# Patient Record
Sex: Female | Born: 1975 | Hispanic: Yes | Marital: Married | Smoking: Never smoker
Health system: Southern US, Community
[De-identification: ages and names within clinical notes are randomized; demographics above are authoritative.]

## PROBLEM LIST (undated history)

## (undated) DIAGNOSIS — A749 Chlamydial infection, unspecified: Secondary | ICD-10-CM

## (undated) DIAGNOSIS — O24419 Gestational diabetes mellitus in pregnancy, unspecified control: Secondary | ICD-10-CM

## (undated) DIAGNOSIS — I10 Essential (primary) hypertension: Secondary | ICD-10-CM

## (undated) DIAGNOSIS — N390 Urinary tract infection, site not specified: Secondary | ICD-10-CM

## (undated) HISTORY — PX: NO PAST SURGERIES: SHX2092

---

## 1998-02-10 ENCOUNTER — Ambulatory Visit (HOSPITAL_COMMUNITY): Admission: RE | Admit: 1998-02-10 | Discharge: 1998-02-10 | Payer: Self-pay | Admitting: Obstetrics

## 1998-04-12 ENCOUNTER — Other Ambulatory Visit: Admission: RE | Admit: 1998-04-12 | Discharge: 1998-04-12 | Payer: Self-pay | Admitting: *Deleted

## 1998-07-10 ENCOUNTER — Inpatient Hospital Stay (HOSPITAL_COMMUNITY): Admission: AD | Admit: 1998-07-10 | Discharge: 1998-07-10 | Payer: Self-pay | Admitting: *Deleted

## 1998-07-12 ENCOUNTER — Inpatient Hospital Stay (HOSPITAL_COMMUNITY): Admission: AD | Admit: 1998-07-12 | Discharge: 1998-07-14 | Payer: Self-pay | Admitting: Obstetrics

## 1998-07-12 ENCOUNTER — Encounter (HOSPITAL_COMMUNITY): Admission: RE | Admit: 1998-07-12 | Discharge: 1998-07-12 | Payer: Self-pay | Admitting: Obstetrics

## 2000-06-05 ENCOUNTER — Encounter: Admission: RE | Admit: 2000-06-05 | Discharge: 2000-06-05 | Payer: Self-pay | Admitting: Internal Medicine

## 2000-10-30 ENCOUNTER — Encounter: Admission: RE | Admit: 2000-10-30 | Discharge: 2000-10-30 | Payer: Self-pay | Admitting: Hematology and Oncology

## 2000-10-30 ENCOUNTER — Encounter: Payer: Self-pay | Admitting: *Deleted

## 2000-10-30 ENCOUNTER — Ambulatory Visit (HOSPITAL_COMMUNITY): Admission: RE | Admit: 2000-10-30 | Discharge: 2000-10-30 | Payer: Self-pay | Admitting: *Deleted

## 2001-01-22 ENCOUNTER — Ambulatory Visit (HOSPITAL_COMMUNITY): Admission: RE | Admit: 2001-01-22 | Discharge: 2001-01-22 | Payer: Self-pay | Admitting: *Deleted

## 2001-04-12 ENCOUNTER — Encounter (HOSPITAL_COMMUNITY): Admission: RE | Admit: 2001-04-12 | Discharge: 2001-04-30 | Payer: Self-pay | Admitting: *Deleted

## 2001-04-29 ENCOUNTER — Inpatient Hospital Stay (HOSPITAL_COMMUNITY): Admission: AD | Admit: 2001-04-29 | Discharge: 2001-05-01 | Payer: Self-pay | Admitting: *Deleted

## 2001-08-27 ENCOUNTER — Encounter: Admission: RE | Admit: 2001-08-27 | Discharge: 2001-08-27 | Payer: Self-pay | Admitting: Internal Medicine

## 2001-11-12 ENCOUNTER — Emergency Department (HOSPITAL_COMMUNITY): Admission: EM | Admit: 2001-11-12 | Discharge: 2001-11-12 | Payer: Self-pay | Admitting: Emergency Medicine

## 2002-04-13 ENCOUNTER — Emergency Department (HOSPITAL_COMMUNITY): Admission: EM | Admit: 2002-04-13 | Discharge: 2002-04-14 | Payer: Self-pay | Admitting: *Deleted

## 2002-08-28 ENCOUNTER — Encounter: Admission: RE | Admit: 2002-08-28 | Discharge: 2002-08-28 | Payer: Self-pay | Admitting: Obstetrics and Gynecology

## 2002-08-29 ENCOUNTER — Encounter: Admission: RE | Admit: 2002-08-29 | Discharge: 2002-08-29 | Payer: Self-pay | Admitting: Internal Medicine

## 2002-11-17 ENCOUNTER — Encounter: Admission: RE | Admit: 2002-11-17 | Discharge: 2002-11-17 | Payer: Self-pay | Admitting: Internal Medicine

## 2003-01-29 ENCOUNTER — Encounter: Admission: RE | Admit: 2003-01-29 | Discharge: 2003-01-29 | Payer: Self-pay | Admitting: Obstetrics and Gynecology

## 2003-05-28 ENCOUNTER — Inpatient Hospital Stay (HOSPITAL_COMMUNITY): Admission: AD | Admit: 2003-05-28 | Discharge: 2003-05-28 | Payer: Self-pay | Admitting: Obstetrics and Gynecology

## 2003-05-29 ENCOUNTER — Inpatient Hospital Stay (HOSPITAL_COMMUNITY): Admission: AD | Admit: 2003-05-29 | Discharge: 2003-05-29 | Payer: Self-pay | Admitting: Family Medicine

## 2003-06-25 ENCOUNTER — Encounter: Admission: RE | Admit: 2003-06-25 | Discharge: 2003-06-25 | Payer: Self-pay | Admitting: Obstetrics and Gynecology

## 2004-08-16 ENCOUNTER — Ambulatory Visit (HOSPITAL_COMMUNITY): Admission: RE | Admit: 2004-08-16 | Discharge: 2004-08-16 | Payer: Self-pay | Admitting: *Deleted

## 2004-09-02 ENCOUNTER — Inpatient Hospital Stay (HOSPITAL_COMMUNITY): Admission: AD | Admit: 2004-09-02 | Discharge: 2004-09-02 | Payer: Self-pay | Admitting: *Deleted

## 2004-09-02 ENCOUNTER — Ambulatory Visit: Payer: Self-pay | Admitting: Family Medicine

## 2004-10-28 ENCOUNTER — Ambulatory Visit (HOSPITAL_COMMUNITY): Admission: RE | Admit: 2004-10-28 | Discharge: 2004-10-28 | Payer: Self-pay | Admitting: Family Medicine

## 2004-11-01 ENCOUNTER — Encounter: Admission: RE | Admit: 2004-11-01 | Discharge: 2004-11-01 | Payer: Self-pay | Admitting: *Deleted

## 2005-01-20 ENCOUNTER — Ambulatory Visit (HOSPITAL_COMMUNITY): Admission: RE | Admit: 2005-01-20 | Discharge: 2005-01-20 | Payer: Self-pay | Admitting: *Deleted

## 2005-01-22 ENCOUNTER — Ambulatory Visit: Payer: Self-pay | Admitting: Obstetrics and Gynecology

## 2005-01-22 ENCOUNTER — Inpatient Hospital Stay (HOSPITAL_COMMUNITY): Admission: AD | Admit: 2005-01-22 | Discharge: 2005-01-24 | Payer: Self-pay | Admitting: Obstetrics & Gynecology

## 2009-05-03 ENCOUNTER — Encounter: Admission: RE | Admit: 2009-05-03 | Discharge: 2009-05-03 | Payer: Self-pay | Admitting: Family Medicine

## 2009-05-28 ENCOUNTER — Emergency Department (HOSPITAL_COMMUNITY): Admission: EM | Admit: 2009-05-28 | Discharge: 2009-05-29 | Payer: Self-pay | Admitting: Emergency Medicine

## 2010-12-18 NOTE — L&D Delivery Note (Signed)
Delivery Note At 9:00 AM a viable female was delivered via Vaginal, Spontaneous Delivery (Presentation: Left Occiput Anterior).  APGAR: 9, 9; weight .   Placenta status: Intact, Spontaneous.  Cord: 3 vessels with the following complications: None.   Anesthesia: Epidural  Episiotomy: None Lacerations: None Est. Blood Loss (mL):  Mom to postpartum.  Baby to nursery-stable.  BOOTH, ERIN 08/06/2011, 9:13 AM

## 2011-01-18 ENCOUNTER — Inpatient Hospital Stay (HOSPITAL_COMMUNITY)
Admission: AD | Admit: 2011-01-18 | Discharge: 2011-01-18 | Disposition: A | Discharge status: Home / Self Care | Payer: Self-pay | Source: Ambulatory Visit | Attending: Obstetrics & Gynecology | Admitting: Obstetrics & Gynecology

## 2011-01-18 DIAGNOSIS — O209 Hemorrhage in early pregnancy, unspecified: Secondary | ICD-10-CM | POA: Insufficient documentation

## 2011-01-18 DIAGNOSIS — B373 Candidiasis of vulva and vagina: Secondary | ICD-10-CM

## 2011-01-18 DIAGNOSIS — O2390 Unspecified genitourinary tract infection in pregnancy, unspecified trimester: Secondary | ICD-10-CM

## 2011-01-18 DIAGNOSIS — B3731 Acute candidiasis of vulva and vagina: Secondary | ICD-10-CM | POA: Insufficient documentation

## 2011-01-18 LAB — URINALYSIS, ROUTINE W REFLEX MICROSCOPIC
Ketones, ur: NEGATIVE mg/dL
Nitrite: NEGATIVE
Protein, ur: NEGATIVE mg/dL
Urine Glucose, Fasting: 250 mg/dL — AB
Urobilinogen, UA: 0.2 mg/dL (ref 0.0–1.0)

## 2011-01-18 LAB — WET PREP, GENITAL
Trich, Wet Prep: NONE SEEN
Yeast Wet Prep HPF POC: NONE SEEN

## 2011-01-18 LAB — URINE MICROSCOPIC-ADD ON

## 2011-02-20 ENCOUNTER — Other Ambulatory Visit: Payer: Self-pay | Admitting: Family Medicine

## 2011-02-20 ENCOUNTER — Ambulatory Visit (HOSPITAL_COMMUNITY)
Admission: RE | Admit: 2011-02-20 | Discharge: 2011-02-20 | Disposition: A | Discharge status: Home / Self Care | Payer: Medicaid Other | Source: Ambulatory Visit | Attending: Family Medicine | Admitting: Family Medicine

## 2011-02-20 DIAGNOSIS — O09299 Supervision of pregnancy with other poor reproductive or obstetric history, unspecified trimester: Secondary | ICD-10-CM | POA: Insufficient documentation

## 2011-02-20 DIAGNOSIS — Z0489 Encounter for examination and observation for other specified reasons: Secondary | ICD-10-CM

## 2011-02-20 DIAGNOSIS — O3680X0 Pregnancy with inconclusive fetal viability, not applicable or unspecified: Secondary | ICD-10-CM

## 2011-02-20 DIAGNOSIS — IMO0002 Reserved for concepts with insufficient information to code with codable children: Secondary | ICD-10-CM

## 2011-02-20 DIAGNOSIS — O99810 Abnormal glucose complicating pregnancy: Secondary | ICD-10-CM | POA: Insufficient documentation

## 2011-02-20 LAB — STREP B DNA PROBE: GBS: POSITIVE

## 2011-02-20 LAB — HIV ANTIBODY (ROUTINE TESTING W REFLEX): HIV: NONREACTIVE

## 2011-02-20 LAB — ABO/RH: RH Type: POSITIVE

## 2011-02-20 LAB — HEPATITIS B SURFACE ANTIGEN: Hepatitis B Surface Ag: NEGATIVE

## 2011-02-20 LAB — RUBELLA ANTIBODY, IGM: Rubella: IMMUNE

## 2011-02-24 ENCOUNTER — Other Ambulatory Visit: Payer: Self-pay | Admitting: Family Medicine

## 2011-02-24 DIAGNOSIS — Z0489 Encounter for examination and observation for other specified reasons: Secondary | ICD-10-CM

## 2011-02-27 ENCOUNTER — Inpatient Hospital Stay (HOSPITAL_COMMUNITY)
Admission: AD | Admit: 2011-02-27 | Discharge: 2011-02-28 | Disposition: A | Discharge status: Home / Self Care | Payer: Medicaid Other | Source: Ambulatory Visit | Attending: Family Medicine | Admitting: Family Medicine

## 2011-02-27 DIAGNOSIS — N390 Urinary tract infection, site not specified: Secondary | ICD-10-CM

## 2011-02-27 DIAGNOSIS — O2390 Unspecified genitourinary tract infection in pregnancy, unspecified trimester: Secondary | ICD-10-CM | POA: Insufficient documentation

## 2011-02-27 DIAGNOSIS — L293 Anogenital pruritus, unspecified: Secondary | ICD-10-CM | POA: Insufficient documentation

## 2011-02-28 LAB — URINE MICROSCOPIC-ADD ON

## 2011-02-28 LAB — URINALYSIS, ROUTINE W REFLEX MICROSCOPIC
Ketones, ur: NEGATIVE mg/dL
Protein, ur: NEGATIVE mg/dL
Urobilinogen, UA: 0.2 mg/dL (ref 0.0–1.0)

## 2011-02-28 LAB — WET PREP, GENITAL
Trich, Wet Prep: NONE SEEN
Yeast Wet Prep HPF POC: NONE SEEN

## 2011-03-01 ENCOUNTER — Encounter (HOSPITAL_COMMUNITY): Payer: Self-pay

## 2011-03-01 ENCOUNTER — Ambulatory Visit (HOSPITAL_COMMUNITY)
Admission: RE | Admit: 2011-03-01 | Discharge: 2011-03-01 | Disposition: A | Discharge status: Home / Self Care | Payer: Medicaid Other | Source: Ambulatory Visit | Attending: Family Medicine | Admitting: Family Medicine

## 2011-03-01 DIAGNOSIS — Z363 Encounter for antenatal screening for malformations: Secondary | ICD-10-CM | POA: Insufficient documentation

## 2011-03-01 DIAGNOSIS — Z1389 Encounter for screening for other disorder: Secondary | ICD-10-CM | POA: Insufficient documentation

## 2011-03-01 DIAGNOSIS — O358XX0 Maternal care for other (suspected) fetal abnormality and damage, not applicable or unspecified: Secondary | ICD-10-CM | POA: Insufficient documentation

## 2011-03-01 DIAGNOSIS — O09299 Supervision of pregnancy with other poor reproductive or obstetric history, unspecified trimester: Secondary | ICD-10-CM | POA: Insufficient documentation

## 2011-03-01 DIAGNOSIS — Z0489 Encounter for examination and observation for other specified reasons: Secondary | ICD-10-CM

## 2011-03-02 ENCOUNTER — Ambulatory Visit (HOSPITAL_COMMUNITY): Payer: Medicaid Other

## 2011-03-02 LAB — URINE CULTURE
Colony Count: 30000
Culture  Setup Time: 201203131100

## 2011-03-27 LAB — POCT I-STAT, CHEM 8
Creatinine, Ser: 0.7 mg/dL (ref 0.4–1.2)
Glucose, Bld: 119 mg/dL — ABNORMAL HIGH (ref 70–99)
HCT: 37 % (ref 36.0–46.0)
Hemoglobin: 12.6 g/dL (ref 12.0–15.0)
Potassium: 3.7 mEq/L (ref 3.5–5.1)
Sodium: 140 mEq/L (ref 135–145)
TCO2: 23 mmol/L (ref 0–100)

## 2011-04-26 IMAGING — US US OB DETAIL+14 WK
1 series · 12 of 28 positions shown · non-contrast
Comparison: none

[Series 1: us ob detail +14 wk · 12 of 80 slices shown]
[im 3/80]
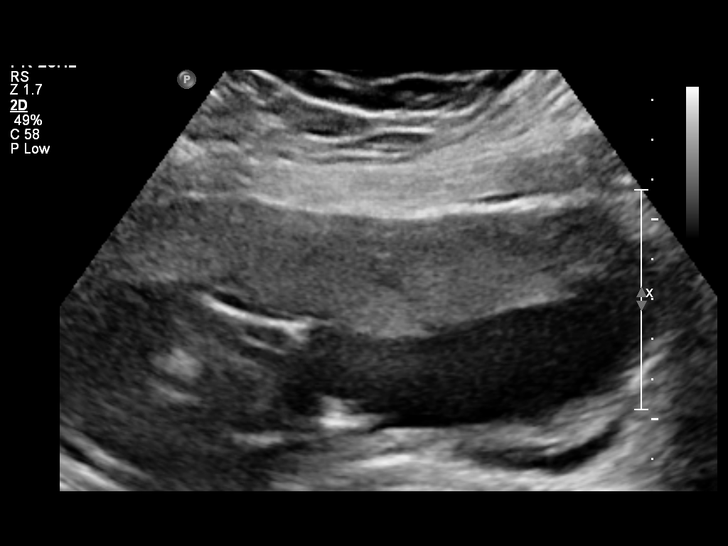
[im 9/80]
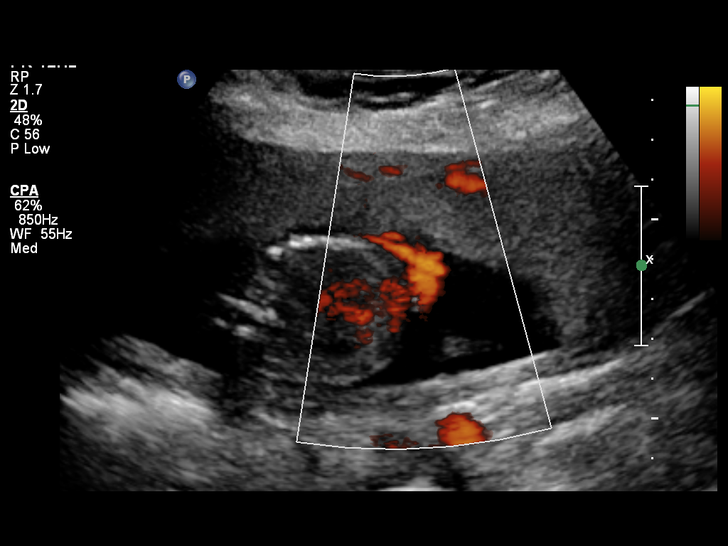
[im 15/80]
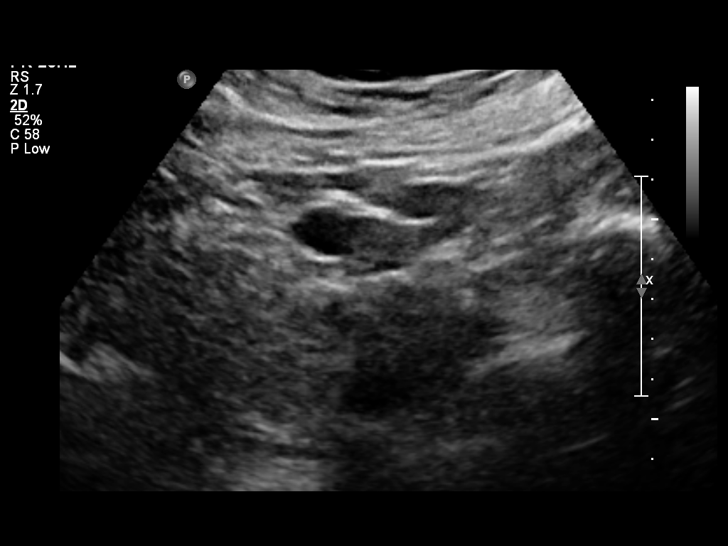
[im 24/80]
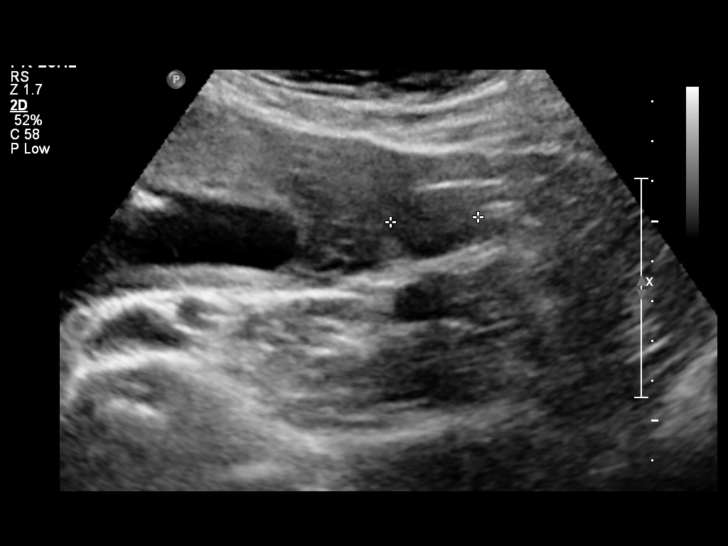
[im 30/80]
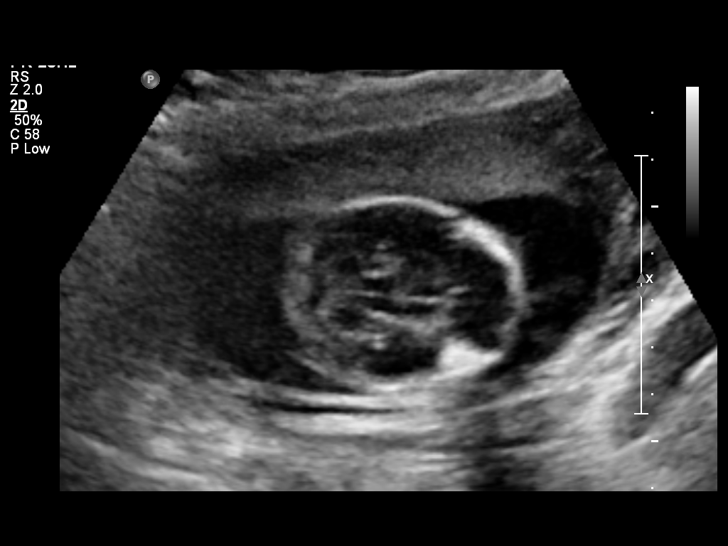
[im 36/80]
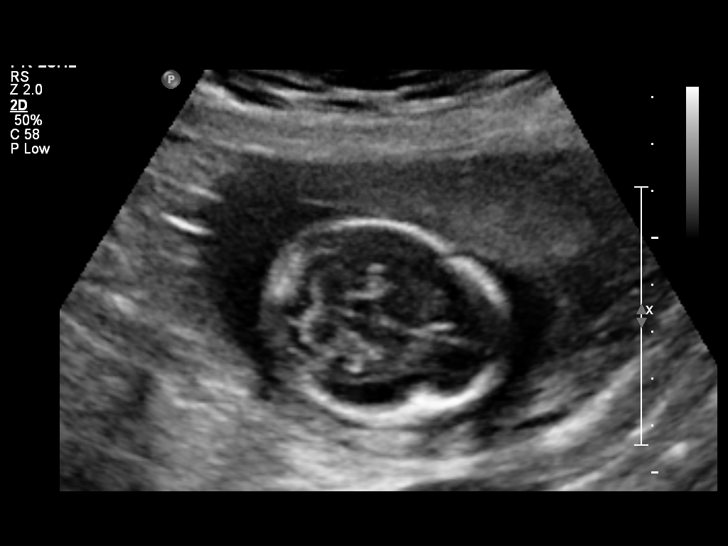
[im 44/80]
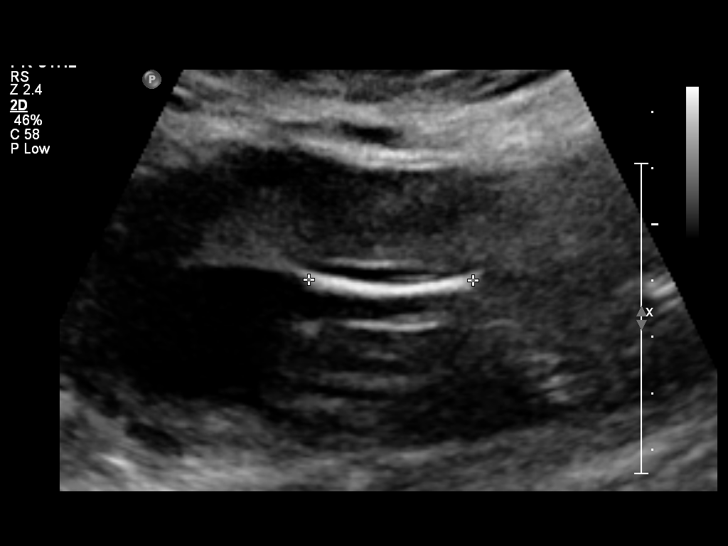
[im 50/80]
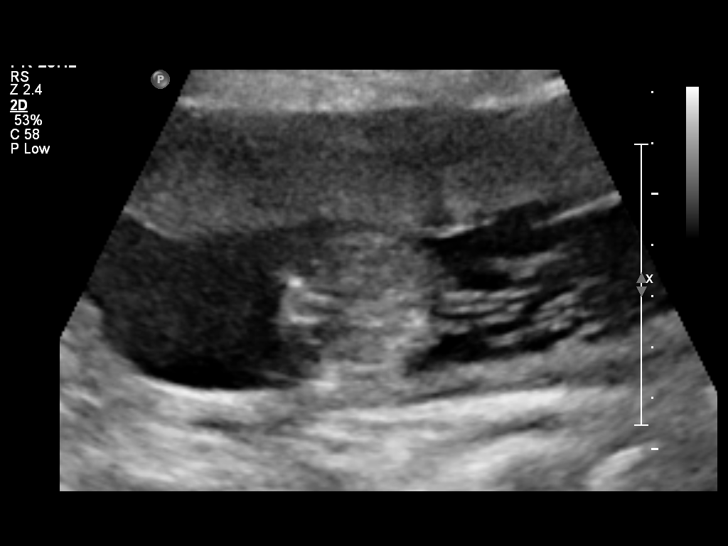
[im 56/80]
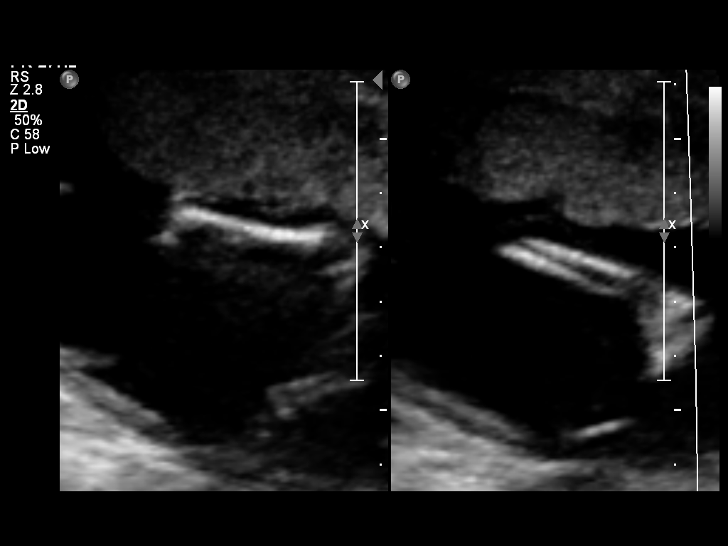
[im 65/80]
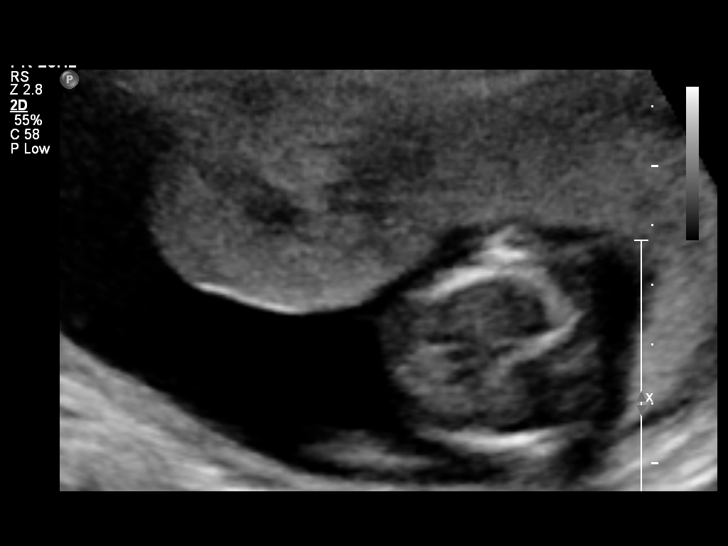
[im 71/80]
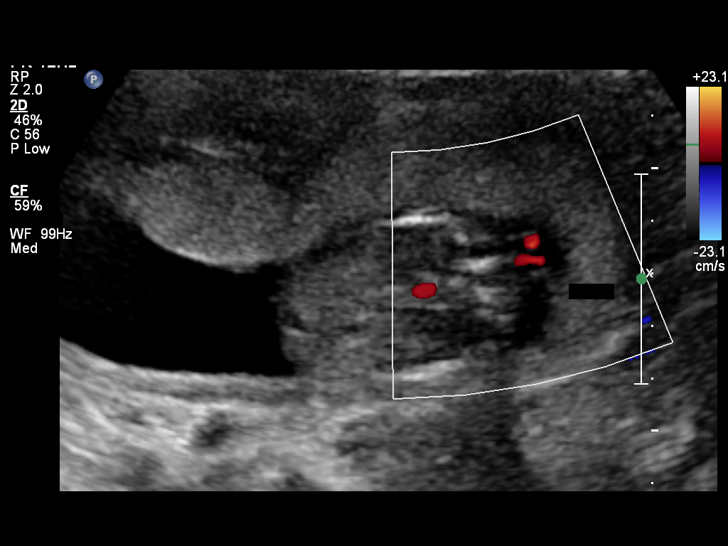
[im 77/80]
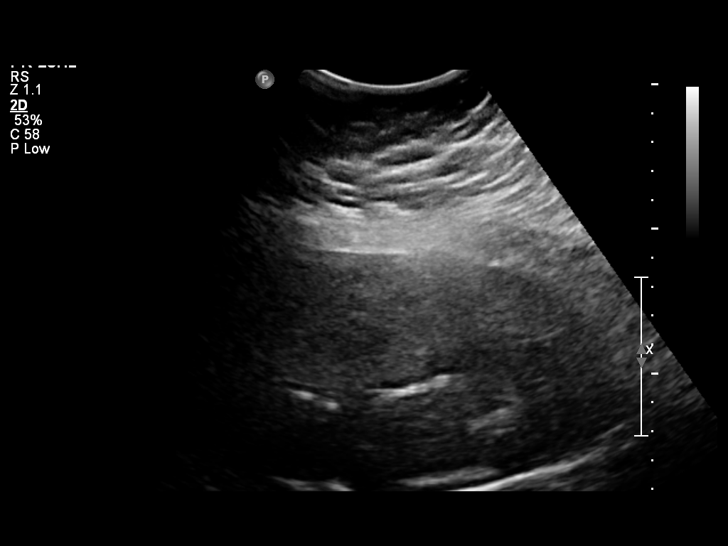

[12 of 28 positions shown; findings below may reference images not displayed]

OBSTETRICS REPORT
                      (Signed Final 03/01/2011 [DATE])

           LAUTUS

 Order#:         [id number]
Procedures

 US OB DETAIL + 14 WK                                  76811.0
Indications

 Detailed fetal anatomic survey
 Poor obstetric history: Previous gestational
 diabetes
Fetal Evaluation

 Fetal Heart Rate:  152                          bpm
 Cardiac Activity:  Observed
 Presentation:      Variable
 Placenta:          Anterior, above cervical os
 P. Cord            Visualized
 Insertion:

 Amniotic Fluid
 AFI FV:      Subjectively within normal limits
                                             Larg Pckt:     4.6  cm
Biometry

 BPD:     40.2  mm     G. Age:  18w 1d                CI:        70.77   70 - 86
                                                      FL/HC:      19.3   15.8 -
                                                                         18
 HC:     152.3  mm     G. Age:  18w 2d       61  %    HC/AC:      1.23   1.07 -

 AC:     123.5  mm     G. Age:  18w 0d       52  %    FL/BPD:
 FL:      29.4  mm     G. Age:  19w 0d       84  %    FL/AC:      23.8   20 - 24
 HUM:     28.7  mm     G. Age:  19w 2d       95  %
 CER:     18.8  mm     G. Age:  18w 3d       65  %
 NFT:     3.26  mm

 Est. FW:     241  gm      0 lb 9 oz     60  %
Gestational Age

 LMP:           17w 6d        Date:  10/27/10                 EDD:   08/03/11
 U/S Today:     18w 3d                                        EDD:   07/30/11
 Best:          17w 6d     Det. By:  LMP  (10/27/10)          EDD:   08/03/11
Anatomy

 Cranium:           Appears normal      Aortic Arch:       Not well
                                                           visualized
 Fetal Cavum:       Appears normal      Ductal Arch:       Not well
                                                           visualized
 Ventricles:        Appears normal      Diaphragm:         Appears normal
 Choroid Plexus:    Appears normal      Stomach:           Appears
                                                           normal, left
                                                           sided
 Cerebellum:        Appears normal      Abdomen:           Appears normal
 Posterior Fossa:   Appears normal      Abdominal Wall:    Appears nml
                                                           (cord insert,
                                                           abd wall)
 Nuchal Fold:       Appears normal      Cord Vessels:      Appears normal
                    (neck, nuchal                          (3 vessel cord)
                    fold)
 Face:              Orbits appear       Kidneys:           Appear normal
                    normal
 Heart:             Appears normal      Bladder:           Appears normal
                    (4 chamber &
                    axis)
 RVOT:              Not well            Spine:             Not well
                    visualized                             visualized
 LVOT:              Not well            Limbs:             Four extremities
                    visualized                             seen

 Other:     Fetus appears to be a male. Technically difficult due to
            maternal habitus and fetal position.
Cervix Uterus Adnexa

 Cervical Length:    4        cm

 Cervix:       Normal appearance by transabdominal scan.
 Left Ovary:    Within normal limits.
 Right Ovary:   ? small paraovarian cyst

 Adnexa:     No abnormality visualized.
Impression

 Single live IUP in variable presentation.  Concordant
 measurements/assigned GA by LMP.
 No anomaly seen in visualized structures as listed above.
 Suboptimal due to fetal position, early GA, and maternal
 habitus.

 LAUTUS with us.  Please do not hesitate to

## 2011-05-05 NOTE — Group Therapy Note (Signed)
   NAME:  KARIGAN, CLONINGER NO.:  [id number]   MEDICAL RECORD NO.:  [id number]                   PATIENT TYPE:  OUT   LOCATION:  [facility]                           FACILITY:  [facility]   PHYSICIAN:  Argentina Donovan, M.D.                   DATE OF BIRTH:  December 20, 1975   DATE OF SERVICE:                                    CLINIC NOTE   CHIEF COMPLAINT:  The patient is a 35 year old gravida 2 para 2-0-0-2 who is  in for her annual examination and is on Seasonale oral contraceptive pills.  The patient was in six months ago complaining about her weight gain on Depo-  Provera and at that time weighed 233.  She has been quite successful and is  down to 219 at this visit since she stopped the Depo-Provera, and has not  been under any significant diet.  She has no physical complaints.  She has  recently been treated at the [facility] for a kidney infection and was placed on  Rocephin and Zithromax.   PHYSICAL EXAMINATION:  NECK:  Thyroid was symmetrical with no masses.  BREASTS:  Symmetrical with no dominant masses, with slight nipple discharge,  milky, on significant palpation.  ABDOMEN:  Soft, flat, nontender.  No masses, no organomegaly.  PELVIC:  External genitalia was normal.  BUS within normal limits.  The  vagina clean and well rugated.  The cervix was parous and clean.  The uterus  anterior, of normal size, shape, consistency, and the adnexa normal.  Pap  smear was taken.   IMPRESSION:  Normal GYN exam.  Prescription for Seasonale oral  contraceptives given.                                               Argentina Donovan, M.D.    PR/MEDQ  D:  06/25/2003  T:  06/25/2003  Job:  [id number]

## 2011-05-05 NOTE — Discharge Summary (Signed)
[facility]  Patient:    Paige Anthony, Paige Anthony          MRN: [id number] Adm. Date:  [id number] Disc. Date: [id number] Attending:  Antionette Char                           Discharge Summary  ADMITTING DIAGNOSES:          Term pregnancy, active labor.  DISCHARGE DIAGNOSES:          Term pregnancy, active labor, status post normal spontaneous vaginal delivery of viable female on Apr 29, 2001 at 1544 with Apgars of 7 at one minute and 9 at five minutes, weight of 3325 g, discharged home in good condition.  REASON FOR ADMISSION:         A 35 year old G2, P1 at 35 6/7 weeks presents with regular, strong uterine contractions.  Pregnancy had been complicated with oligohydramnios.  PAST MEDICAL HISTORY:         Please refer to chart for full historic data.  PHYSICAL EXAMINATION  GENERAL:                      Well-nourished, well-developed female in no acute distress.  VITAL SIGNS:                  Temperature 97.1, pulse 82, respiratory rate 20, blood pressure 118/82.  LUNGS:                        Clear to auscultation and percussion bilaterally.  CARDIOVASCULAR:               Regular rate and rhythm with no murmurs, rubs, or gallops.  ABDOMEN:                      Soft, gravid, nontender.  PELVIC:                       Cervix is 4 cm, 70% effaced, vertex at -2 station.  External fetal monitor revealed uterine contractions every four to six minutes.  LABORATORIES:                 Hemoglobin 13.8, hematocrit 41.0, white blood cell count 13.1, platelets 267,000.  Comprehensive metabolic panel was within normal limits.  RPR was nonreactive.  HOSPITAL COURSE:              Patient was admitted and progressed to normal spontaneous vaginal delivery, uncomplicated.  Postpartum course was uncomplicated and she was discharged home on postpartum day #2 in good condition.  DISCHARGE LABORATORIES:       Hemoglobin 9.8, white blood cell count  15,200, platelets 236,000.  DISCHARGE MEDICATIONS:        1. Continue prenatal vitamins.                               2. Motrin 600 mg q.6h. p.r.n. for pain.                               3. Micronor for contraception with instructions  for starting.  DISCHARGE INSTRUCTIONS:       Routine written instructions were given per booklet for vaginal delivery.  [facility] DD:  07/12/01 TD:  07/13/01 Job: [id number]

## 2011-06-15 ENCOUNTER — Inpatient Hospital Stay (HOSPITAL_COMMUNITY)
Admission: AD | Admit: 2011-06-15 | Discharge: 2011-06-15 | Disposition: A | Discharge status: Home / Self Care | Payer: Self-pay | Source: Ambulatory Visit | Attending: Family Medicine | Admitting: Family Medicine

## 2011-06-15 DIAGNOSIS — Y92410 Unspecified street and highway as the place of occurrence of the external cause: Secondary | ICD-10-CM | POA: Insufficient documentation

## 2011-06-15 DIAGNOSIS — R109 Unspecified abdominal pain: Secondary | ICD-10-CM | POA: Insufficient documentation

## 2011-06-15 DIAGNOSIS — O99891 Other specified diseases and conditions complicating pregnancy: Secondary | ICD-10-CM | POA: Insufficient documentation

## 2011-06-15 LAB — URINALYSIS, ROUTINE W REFLEX MICROSCOPIC
Bilirubin Urine: NEGATIVE
Hgb urine dipstick: NEGATIVE
Nitrite: NEGATIVE
Protein, ur: NEGATIVE mg/dL
Specific Gravity, Urine: 1.02 (ref 1.005–1.030)
Urobilinogen, UA: 0.2 mg/dL (ref 0.0–1.0)

## 2011-08-06 ENCOUNTER — Inpatient Hospital Stay (HOSPITAL_COMMUNITY)
Admission: AD | Admit: 2011-08-06 | Discharge: 2011-08-08 | DRG: 775 | Disposition: A | Discharge status: Home / Self Care | Payer: Medicaid Other | Source: Ambulatory Visit | Attending: Obstetrics and Gynecology | Admitting: Obstetrics and Gynecology

## 2011-08-06 ENCOUNTER — Inpatient Hospital Stay (HOSPITAL_COMMUNITY): Payer: Medicaid Other | Admitting: Anesthesiology

## 2011-08-06 ENCOUNTER — Encounter (HOSPITAL_COMMUNITY): Payer: Self-pay

## 2011-08-06 ENCOUNTER — Encounter (HOSPITAL_COMMUNITY): Payer: Self-pay | Admitting: Anesthesiology

## 2011-08-06 DIAGNOSIS — O9989 Other specified diseases and conditions complicating pregnancy, childbirth and the puerperium: Secondary | ICD-10-CM

## 2011-08-06 DIAGNOSIS — O99892 Other specified diseases and conditions complicating childbirth: Secondary | ICD-10-CM | POA: Diagnosis present

## 2011-08-06 DIAGNOSIS — Z22330 Carrier of Group B streptococcus: Secondary | ICD-10-CM

## 2011-08-06 HISTORY — DX: Essential (primary) hypertension: I10

## 2011-08-06 HISTORY — DX: Urinary tract infection, site not specified: N39.0

## 2011-08-06 HISTORY — DX: Gestational diabetes mellitus in pregnancy, unspecified control: O24.419

## 2011-08-06 HISTORY — DX: Chlamydial infection, unspecified: A74.9

## 2011-08-06 LAB — CBC
MCV: 87.9 fL (ref 78.0–100.0)
Platelets: 208 10*3/uL (ref 150–400)
RBC: 4.12 MIL/uL (ref 3.87–5.11)
WBC: 11.6 10*3/uL — ABNORMAL HIGH (ref 4.0–10.5)

## 2011-08-06 LAB — RPR: RPR Ser Ql: NONREACTIVE

## 2011-08-06 MED ORDER — ACETAMINOPHEN 325 MG PO TABS: 650.0000 mg | ORAL_TABLET | ORAL | Status: DC | PRN

## 2011-08-06 MED ORDER — ALBUTEROL SULFATE HFA 108 (90 BASE) MCG/ACT IN AERS
2.0000 | INHALATION_SPRAY | Freq: Four times a day (QID) | RESPIRATORY_TRACT | Status: DC | PRN
  Filled 2011-08-06: qty 6.7

## 2011-08-06 MED ORDER — DIPHENHYDRAMINE HCL 25 MG PO CAPS
25.0000 mg | ORAL_CAPSULE | Freq: Four times a day (QID) | ORAL | Status: DC | PRN
  Administered 2011-08-06: 25 mg via ORAL
  Filled 2011-08-06: qty 1

## 2011-08-06 MED ORDER — ZOLPIDEM TARTRATE 5 MG PO TABS: 5.0000 mg | ORAL_TABLET | Freq: Every evening | ORAL | Status: DC | PRN

## 2011-08-06 MED ORDER — FLEET ENEMA 7-19 GM/118ML RE ENEM: 1.0000 | ENEMA | RECTAL | Status: DC | PRN

## 2011-08-06 MED ORDER — EPHEDRINE 5 MG/ML INJ
10.0000 mg | INTRAVENOUS | Status: DC | PRN
  Filled 2011-08-06 (×2): qty 4

## 2011-08-06 MED ORDER — IBUPROFEN 600 MG PO TABS: 600.0000 mg | ORAL_TABLET | Freq: Four times a day (QID) | ORAL | Status: DC | PRN

## 2011-08-06 MED ORDER — IBUPROFEN 600 MG PO TABS
600.0000 mg | ORAL_TABLET | Freq: Four times a day (QID) | ORAL | Status: DC
  Administered 2011-08-06 – 2011-08-08 (×8): 600 mg via ORAL
  Filled 2011-08-06 (×8): qty 1

## 2011-08-06 MED ORDER — ONDANSETRON HCL 4 MG PO TABS: 4.0000 mg | ORAL_TABLET | ORAL | Status: DC | PRN

## 2011-08-06 MED ORDER — OXYCODONE-ACETAMINOPHEN 5-325 MG PO TABS: 1.0000 | ORAL_TABLET | ORAL | Status: DC | PRN

## 2011-08-06 MED ORDER — ONDANSETRON HCL 4 MG/2ML IJ SOLN: 4.0000 mg | INTRAMUSCULAR | Status: DC | PRN

## 2011-08-06 MED ORDER — LACTATED RINGERS IV SOLN: 500.0000 mL | Freq: Once | INTRAVENOUS | Status: DC

## 2011-08-06 MED ORDER — EPHEDRINE 5 MG/ML INJ
10.0000 mg | INTRAVENOUS | Status: DC | PRN
  Filled 2011-08-06: qty 4

## 2011-08-06 MED ORDER — LACTATED RINGERS IV SOLN
INTRAVENOUS | Status: DC
  Administered 2011-08-06 (×2): via INTRAVENOUS

## 2011-08-06 MED ORDER — FENTANYL 2.5 MCG/ML BUPIVACAINE 1/10 % EPIDURAL INFUSION (WH - ANES)
14.0000 mL/h | INTRAMUSCULAR | Status: DC
  Administered 2011-08-06: 14 mL/h via EPIDURAL
  Filled 2011-08-06: qty 60

## 2011-08-06 MED ORDER — PHENYLEPHRINE 40 MCG/ML (10ML) SYRINGE FOR IV PUSH (FOR BLOOD PRESSURE SUPPORT)
80.0000 ug | PREFILLED_SYRINGE | INTRAVENOUS | Status: DC | PRN
  Filled 2011-08-06: qty 5

## 2011-08-06 MED ORDER — LACTATED RINGERS IV SOLN: 500.0000 mL | INTRAVENOUS | Status: DC | PRN

## 2011-08-06 MED ORDER — BENZOCAINE-MENTHOL 20-0.5 % EX AERO: 1.0000 "application " | INHALATION_SPRAY | CUTANEOUS | Status: DC | PRN

## 2011-08-06 MED ORDER — OXYTOCIN 20 UNITS IN LACTATED RINGERS INFUSION - SIMPLE
125.0000 mL/h | INTRAVENOUS | Status: DC
  Administered 2011-08-06: 999 mL/h via INTRAVENOUS

## 2011-08-06 MED ORDER — SENNOSIDES-DOCUSATE SODIUM 8.6-50 MG PO TABS
2.0000 | ORAL_TABLET | Freq: Every day | ORAL | Status: DC
  Administered 2011-08-06 – 2011-08-07 (×2): 2 via ORAL

## 2011-08-06 MED ORDER — ONDANSETRON HCL 4 MG/2ML IJ SOLN: 4.0000 mg | Freq: Four times a day (QID) | INTRAMUSCULAR | Status: DC | PRN

## 2011-08-06 MED ORDER — BENZOCAINE-MENTHOL 20-0.5 % EX AERO
INHALATION_SPRAY | CUTANEOUS | Status: AC
  Filled 2011-08-06: qty 56

## 2011-08-06 MED ORDER — PENICILLIN G POTASSIUM 5000000 UNITS IJ SOLR
2.5000 10*6.[IU] | INTRAVENOUS | Status: DC
  Filled 2011-08-06 (×2): qty 2.5

## 2011-08-06 MED ORDER — PENICILLIN G POTASSIUM 5000000 UNITS IJ SOLR
5.0000 10*6.[IU] | Freq: Once | INTRAMUSCULAR | Status: DC
  Filled 2011-08-06: qty 5

## 2011-08-06 MED ORDER — SODIUM CHLORIDE 0.9 % IV SOLN
2.0000 g | Freq: Once | INTRAVENOUS | Status: AC
  Administered 2011-08-06: 2 g via INTRAVENOUS
  Filled 2011-08-06: qty 2000

## 2011-08-06 MED ORDER — DIBUCAINE 1 % RE OINT: 1.0000 "application " | TOPICAL_OINTMENT | RECTAL | Status: DC | PRN

## 2011-08-06 MED ORDER — OXYTOCIN BOLUS FROM INFUSION
500.0000 mL | Freq: Once | INTRAVENOUS | Status: DC
  Filled 2011-08-06: qty 1000
  Filled 2011-08-06: qty 500

## 2011-08-06 MED ORDER — PHENYLEPHRINE 40 MCG/ML (10ML) SYRINGE FOR IV PUSH (FOR BLOOD PRESSURE SUPPORT)
80.0000 ug | PREFILLED_SYRINGE | INTRAVENOUS | Status: DC | PRN
  Filled 2011-08-06 (×2): qty 5

## 2011-08-06 MED ORDER — DIPHENHYDRAMINE HCL 50 MG/ML IJ SOLN: 12.5000 mg | INTRAMUSCULAR | Status: DC | PRN

## 2011-08-06 MED ORDER — CITRIC ACID-SODIUM CITRATE 334-500 MG/5ML PO SOLN: 30.0000 mL | ORAL | Status: DC | PRN

## 2011-08-06 MED ORDER — BECLOMETHASONE DIPROPIONATE 80 MCG/ACT IN AERS
2.0000 | INHALATION_SPRAY | Freq: Every day | RESPIRATORY_TRACT | Status: DC
  Filled 2011-08-06: qty 8.7

## 2011-08-06 MED ORDER — LIDOCAINE HCL (PF) 1 % IJ SOLN
30.0000 mL | INTRAMUSCULAR | Status: DC | PRN
  Filled 2011-08-06 (×2): qty 30

## 2011-08-06 MED ORDER — PRENATAL PLUS 27-1 MG PO TABS
1.0000 | ORAL_TABLET | Freq: Every day | ORAL | Status: DC
  Administered 2011-08-08: 1 via ORAL
  Filled 2011-08-06: qty 1

## 2011-08-06 MED ORDER — OXYCODONE-ACETAMINOPHEN 5-325 MG PO TABS: 2.0000 | ORAL_TABLET | ORAL | Status: DC | PRN

## 2011-08-06 MED ORDER — WITCH HAZEL-GLYCERIN EX PADS: 1.0000 "application " | MEDICATED_PAD | CUTANEOUS | Status: DC | PRN

## 2011-08-06 MED ORDER — TETANUS-DIPHTH-ACELL PERTUSSIS 5-2.5-18.5 LF-MCG/0.5 IM SUSP
0.5000 mL | Freq: Once | INTRAMUSCULAR | Status: AC
Start: 2011-08-07 — End: 2011-08-07
  Administered 2011-08-07: 0.5 mL via INTRAMUSCULAR
  Filled 2011-08-06: qty 0.5

## 2011-08-06 MED ORDER — LANOLIN HYDROUS EX OINT: TOPICAL_OINTMENT | CUTANEOUS | Status: DC | PRN

## 2011-08-06 MED ORDER — SIMETHICONE 80 MG PO CHEW: 80.0000 mg | CHEWABLE_TABLET | ORAL | Status: DC | PRN

## 2011-08-06 NOTE — Progress Notes (Signed)
Patient is here for labor eval.

## 2011-08-06 NOTE — Anesthesia Preprocedure Evaluation (Signed)
Anesthesia Evaluation  Name, MR# and DOB Patient awake  General Assessment Comment  Reviewed: Allergy & Precautions, H&P , Patient's Chart, lab work & pertinent test results  Airway Mallampati: II TM Distance: >3 FB Neck ROM: full    Dental  (+) Teeth Intact   Pulmonary  asthma (no inhaler for 1 month)  clear to auscultation  breath sounds clear to auscultation none    Cardiovascular regular Normal    Neuro/Psych   GI/Hepatic/Renal   Endo/Other    Abdominal   Musculoskeletal   Hematology   Peds  Reproductive/Obstetrics (+) Pregnancy    Anesthesia Other Findings                 Anesthesia Physical Anesthesia Plan  ASA: III  Anesthesia Plan: Epidural   Post-op Pain Management:    Induction:   Airway Management Planned:   Additional Equipment:   Intra-op Plan:   Post-operative Plan:   Informed Consent: I have reviewed the patients History and Physical, chart, labs and discussed the procedure including the risks, benefits and alternatives for the proposed anesthesia with the patient or authorized representative who has indicated his/her understanding and acceptance.   Dental Advisory Given  Plan Discussed with: CRNA and Surgeon  Anesthesia Plan Comments: (Labs checked- platelets confirmed with RN in room. Fetal heart tracing, per RN, reportedly stable enough for sitting procedure. Discussed epidural, and patient consents to the procedure:  included risk of possible headache,backache, failed block, allergic reaction, and nerve injury. This patient was asked if she had any questions or concerns before the procedure started. )        Anesthesia Quick Evaluation

## 2011-08-06 NOTE — Anesthesia Procedure Notes (Addendum)
Epidural Patient location during procedure: OB Start time: 08/06/2011 6:32 AM  Staffing Anesthesiologist: Jiles Garter  Preanesthetic Checklist Completed: patient identified, site marked, surgical consent, pre-op evaluation, timeout performed, IV checked, risks and benefits discussed and monitors and equipment checked  Epidural Patient position: sitting Prep: site prepped and draped and DuraPrep Patient monitoring: continuous pulse ox and blood pressure Approach: midline Injection technique: LOR air  Needle:  Needle type: Tuohy  Needle gauge: 17 G Needle length: 9 cm Needle insertion depth: 7 cm Catheter type: closed end flexible Catheter size: 19 Gauge Catheter at skin depth: 14 cm Test dose: negative  Assessment Events: blood not aspirated, injection not painful, no injection resistance, negative IV test and no paresthesia  Additional Notes Dosing of Epidural: 1st dose, Through needle...... 5mg  Marcaine 2nd dose, through catheter.... epi 1:200K + Xylocaine 40 mg 3rd dose, through catheter...Marland KitchenMarland Kitchenepi 1:200K + Xylocaine 60 mg Each dose occurred after waiting 3 min,patient was free of IV sx; and patient exhibits no evidence of SA injection  Patient is more comfortable after epidural dosed. Please see RN's note for documentation of vital signs,and FHR which are stable.

## 2011-08-06 NOTE — H&P (Signed)
Paige Anthony is a 35 y.o. female presenting for contractions. Maternal Medical History:  Prenatal Complications - Diabetes: none.    OB History    Grav Para Term Preterm Abortions TAB SAB Ect Mult Living   4 3 3       3      Past Medical History  Diagnosis Date  . UTI (lower urinary tract infection)   . Hypertension   . Asthma   . Chlamydia   . Gestational diabetes    History reviewed. No pertinent past surgical history. Family History: family history is not on file. Social History:  reports that she has never smoked. She does not have any smokeless tobacco history on file. She reports that she does not drink alcohol or use illicit drugs.  ROS  Dilation: 5 Effacement (%): 70 Station: -2 Exam by:: Peace, rn Blood pressure 142/86, pulse 80, temperature 97.7 F (36.5 C), temperature source Oral, resp. rate 20, SpO2 97.00%. Maternal Exam:  Uterine Assessment: Contraction strength is mild.  Contraction frequency is irregular.   Pelvis: adequate for delivery.   Cervix: Cervix evaluated by digital exam.    FHR 125, accels present, variables sometimes assoc with ctx, to nadir of 70 x 60-90 sec Physical Exam  Constitutional: She is oriented to person, place, and time. She appears well-developed and well-nourished.  HENT:  Head: Normocephalic.  Cardiovascular: Normal rate.   Respiratory: Effort normal.  GI: Soft.  Musculoskeletal: Normal range of motion.  Neurological: She is alert and oriented to person, place, and time. She has normal reflexes.    Prenatal labs: ABO, Rh: O/Positive/-- (03/05 0000) Antibody: Negative (03/05 0000) Rubella:   RPR:    HBsAg: Negative (03/05 0000)  HIV: Non-reactive (03/05 0000)  GBS: Positive (03/05 0000)   Assessment/Plan: IUP at 40.3 Advanced cx change vs early labor FHR variables  Will admit to L&D for obs of FHR/labor for now.   Paige Anthony 08/06/2011, 5:02 AM

## 2011-08-06 NOTE — Anesthesia Postprocedure Evaluation (Signed)
Anesthesia Post Note  Patient: Paige Anthony  Procedure(s) Performed: * No procedures listed *  Anesthesia type: Epidural  Patient location: Mother/Baby  Post pain: Pain level controlled  Post assessment: Post-op Vital signs reviewed  Last Vitals:  Filed Vitals:   08/06/11 0950  BP: 114/44  Pulse: 62  Temp:   Resp: 18    Post vital signs: Reviewed  Level of consciousness: awake  Complications: No apparent anesthesia complications

## 2011-08-07 ENCOUNTER — Other Ambulatory Visit: Payer: Self-pay

## 2011-08-07 NOTE — Anesthesia Postprocedure Evaluation (Signed)
  Anesthesia Post-op Note  Patient: Paige Anthony  Procedure(s) Performed: * No procedures listed *  Patient Location: PACU and Mother/Baby  Anesthesia Type: Epidural  Level of Consciousness: awake, alert  and oriented  Airway and Oxygen Therapy: Patient Spontanous Breathing  Post-op Pain: none  Post-op Assessment: Post-op Vital signs reviewed, No headache, No backache, No residual numbness and No residual motor weakness  Post-op Vital Signs: Reviewed and stable  Complications: No apparent anesthesia complications

## 2011-08-07 NOTE — Progress Notes (Signed)
Post Partum Day 1 Subjective: no complaints, up ad lib, voiding, tolerating PO and + flatus  Objective: Blood pressure 99/66, pulse 72, temperature 97.7 F (36.5 C), temperature source Oral, resp. rate 20, height 5\' 5"  (1.651 m), weight 109.77 kg (242 lb), SpO2 99.00%, unknown if currently breastfeeding.  Physical Exam:  General: alert, cooperative, appears stated age and no distress Lochia: appropriate Uterine Fundus: firm Incision:  DVT Evaluation: No evidence of DVT seen on physical exam. Negative Homan's sign. No cords or calf tenderness.   Basename 08/06/11 0519  HGB 12.1  HCT 36.2    Assessment/Plan: Plan for discharge tomorrow   LOS: 1 day   Zerita Boers 08/07/2011, 6:01 AM

## 2011-08-07 NOTE — Progress Notes (Signed)
UR chart review completed.  

## 2011-08-07 NOTE — Addendum Note (Signed)
Addendum  created 08/07/11 0818 by Madison Hickman   Modules edited:Charges VN, Notes Section

## 2011-08-07 NOTE — H&P (Signed)
Agree with above note.  Paige Anthony 08/07/2011 3:59 PM   

## 2011-08-08 MED ORDER — IBUPROFEN 600 MG PO TABS: 600.0000 mg | ORAL_TABLET | Freq: Four times a day (QID) | ORAL | Status: AC

## 2011-08-08 MED ORDER — OXYCODONE-ACETAMINOPHEN 5-325 MG PO TABS: 1.0000 | ORAL_TABLET | ORAL | Status: AC | PRN

## 2011-08-08 MED ORDER — ALBUTEROL SULFATE HFA 108 (90 BASE) MCG/ACT IN AERS: 2.0000 | INHALATION_SPRAY | Freq: Four times a day (QID) | RESPIRATORY_TRACT | Status: DC | PRN

## 2011-08-08 NOTE — Progress Notes (Signed)
Post Partum Day 2 Subjective: no complaints, up ad lib, voiding, tolerating PO and + flatus  Objective: Blood pressure 87/56, pulse 61, temperature 98.7 F (37.1 C), temperature source Oral, resp. rate 17, height 5\' 5"  (1.651 m), weight 109.77 kg (242 lb), SpO2 99.00%, unknown if currently breastfeeding.  Physical Exam:  General: alert, cooperative, appears stated age and no distress Lochia: appropriate Uterine Fundus: firm Incision:  DVT Evaluation: No evidence of DVT seen on physical exam. Negative Homan's sign. No cords or calf tenderness. No significant calf/ankle edema.   Basename 08/06/11 0519  HGB 12.1  HCT 36.2    Assessment/Plan: Discharge home   LOS: 2 days   Zerita Boers 08/08/2011, 6:24 AM

## 2011-08-08 NOTE — Discharge Summary (Signed)
Obstetric Discharge Summary Reason for Admission: onset of labor Prenatal Procedures: ultrasound Intrapartum Procedures: spontaneous vaginal delivery Postpartum Procedures: none Complications-Operative and Postpartum: none Hemoglobin  Date Value Range Status  08/06/2011 12.1  12.0-15.0 (g/dL) Final     HCT  Date Value Range Status  08/06/2011 36.2  36.0-46.0 (%) Final    Discharge Diagnoses: Term Pregnancy-delivered  Discharge Information: Date: 08/08/2011 Activity: pelvic rest Diet: routine Medications: PNV, Ibuprophen and Percocet Condition: stable Instructions: refer to practice specific booklet Discharge to: home   Newborn Data: Live born female  Birth Weight: 6 lb 15.8 oz (3170 g) APGAR: 9, 9  Home with mother.  Zerita Boers 08/08/2011, 6:29 AM

## 2011-08-10 ENCOUNTER — Inpatient Hospital Stay (HOSPITAL_COMMUNITY): Admission: RE | Admit: 2011-08-10 | Payer: Self-pay | Source: Ambulatory Visit

## 2011-08-13 ENCOUNTER — Encounter (HOSPITAL_COMMUNITY): Payer: Self-pay

## 2014-10-19 ENCOUNTER — Encounter (HOSPITAL_COMMUNITY): Payer: Self-pay

## 2015-09-20 ENCOUNTER — Encounter: Payer: Self-pay | Admitting: Family Medicine

## 2015-10-28 DIAGNOSIS — Z23 Encounter for immunization: Secondary | ICD-10-CM

## 2015-10-28 NOTE — Congregational Nurse Program (Signed)
Congregational Nurse Program Note  Date of Encounter: 10/28/2015  Past Medical History: Past Medical History  Diagnosis Date  . UTI (lower urinary tract infection)   . Hypertension   . Asthma   . Chlamydia   . Gestational diabetes     Encounter Details:     CNP Questionnaire - 10/28/15 1459    Patient Demographics   Is this a new or existing patient? New   Patient is considered a/an Immigrant   Patient Assistance   Patient's financial/insurance status Low Income;Uninsured   Patient referred to apply for the following financial assistance [employer]   Food insecurities addressed Not Applicable   Transportation assistance No   Assistance securing medications No   Educational health offerings Chronic disease;Navigating the healthcare system;Health literacy;Spiritual care   Encounter Details   Primary purpose of visit Chronic Illness/Condition Visit;Navigating the Healthcare System;Education/Health Concerns   Was an Emergency Department visit averted? No   Does patient have a medical provider? No   Patient referred to Establish PCP   Was a mental health screening completed? (GAINS tool) No   Does patient have dental issues? No   Since previous encounter, have you referred patient for abnormal blood pressure that resulted in a new diagnosis or medication change? No   Since previous encounter, have you referred patient for abnormal blood glucose that resulted in a new diagnosis or medication change? No        [facility]  Flu vaccine given, see note.  Client has no health insurance, referred to apply to orange card.  Has asthma and sees allergy specialist that sees her children.  She is on Qvar and albuterol inhaler.  She does well if she takes Qvar regularly, but if not has some wheezing.  Today she is concerned about the issues of deportation  Raised by the president elect.  Some in the Hispanic community are in fear and have had their children bullied  on the buses and at school.  Allowed to ventilate and offered consolation and validation of concerns.  Encouraged to keep concerns as best can and help children deal with feelings.  Will f/up with client on application for orange card and anxiety

## 2016-03-14 ENCOUNTER — Encounter: Payer: Self-pay | Admitting: Family Medicine

## 2016-03-14 ENCOUNTER — Ambulatory Visit (INDEPENDENT_AMBULATORY_CARE_PROVIDER_SITE_OTHER): Payer: Self-pay | Admitting: Family Medicine

## 2016-03-14 VITALS — BP 118/72 | HR 80 | Temp 98.5°F | Resp 16

## 2016-03-14 DIAGNOSIS — J020 Streptococcal pharyngitis: Secondary | ICD-10-CM

## 2016-03-14 LAB — STREP GROUP A AG, W/REFLEX TO CULT: STREGTOCOCCUS GROUP A AG SCREEN: DETECTED — AB

## 2016-03-14 MED ORDER — AMOXICILLIN 500 MG PO CAPS: 500.0000 mg | ORAL_CAPSULE | Freq: Two times a day (BID) | ORAL | Status: DC

## 2016-03-14 MED ORDER — MAGIC MOUTHWASH: 5.0000 mL | Freq: Three times a day (TID) | ORAL | Status: DC | PRN

## 2016-03-14 NOTE — Progress Notes (Signed)
Patient ID: Paige Anthony, female   DOB: 08/25/1976, 40 y.o.   MRN: [id number] 03/14/2016     Subjective:    Patient ID: Paige Anthony, female    DOB: 01/17/1976, 40 y.o.   MRN: [id number]  Patient presents for Illness Patient here with both of her 2 boys who are sick. For the past 24 hours she's had sore throat fever and headache body aches she's not had any significant cough. She is not taking anything over-the-counter. She does have underlying asthma but is not had any problems with her asthma recently.    Review Of Systems:  GEN- denies fatigue,+ fever, weight loss,weakness, recent illness HEENT- denies eye drainage, change in vision, nasal discharge, CVS- denies chest pain, palpitations RESP- denies SOB, cough, wheeze ABD- denies N/V, change in stools, abd pain GU- denies dysuria, hematuria, dribbling, incontinence MSK- denies joint pain, muscle aches, injury Neuro- + headache, dizziness, syncope, seizure activity       Objective:    BP 118/72 mmHg  Pulse 80  Temp(Src) 98.5 F (36.9 C) (Oral)  Resp 16 GEN- NAD, alert and oriented x3 HEENT- PERRL, EOMI, non injected sclera, pink conjunctiva, MMM, oropharynx +injection, +tonsilar exudates  TM clear bilat no effusion, no  maxillary sinus tenderness, nares clear Neck- Supple, + LAD CVS- RRR, no murmur RESP-CTAB Pulses- Radial 2+         Assessment & Plan:      Problem List Items Addressed This Visit    None    Visit Diagnoses    Strep pharyngitis    -  Primary    Strep positive, treat with amox, magic mouthwash, ibuprofen    Relevant Medications    magic mouthwash SOLN    Other Relevant Orders    STREP GROUP A AG, W/REFLEX TO CULT (Completed)       Note: This dictation was prepared with Dragon dictation along with smaller phrase technology. Any transcriptional errors that result from this process are unintentional.

## 2016-03-14 NOTE — Patient Instructions (Signed)
Take antibiotics as prescribed Mouthwash as prescribed Motrin for pain F/U as needed

## 2016-11-29 ENCOUNTER — Other Ambulatory Visit: Payer: Self-pay | Admitting: Family Medicine

## 2016-11-29 MED ORDER — OSELTAMIVIR PHOSPHATE 75 MG PO CAPS: 75.0000 mg | ORAL_CAPSULE | Freq: Every day | ORAL | 0 refills | Status: DC

## 2016-11-29 NOTE — Progress Notes (Signed)
Children with flu She has asthma caring for them Will give her prophylaxis Tamiflu

## 2016-12-01 ENCOUNTER — Ambulatory Visit: Payer: Self-pay

## 2017-02-15 ENCOUNTER — Other Ambulatory Visit: Payer: Self-pay | Admitting: Family Medicine

## 2017-10-22 LAB — GLUCOSE, 1 HOUR: GLUCOSE 1 HOUR: 119

## 2017-10-22 LAB — OB RESULTS CONSOLE HGB/HCT, BLOOD
HEMATOCRIT: 35
HEMATOCRIT: 36
HEMOGLOBIN: 11.6
Hemoglobin: 11.7

## 2017-10-22 LAB — OB RESULTS CONSOLE GC/CHLAMYDIA
Chlamydia: NEGATIVE
Gonorrhea: NEGATIVE

## 2017-10-22 LAB — OB RESULTS CONSOLE ANTIBODY SCREEN: Antibody Screen: NEGATIVE

## 2017-10-22 LAB — CYSTIC FIBROSIS DIAGNOSTIC STUDY: Interpretation-CFDNA:: NEGATIVE

## 2017-10-22 LAB — OB RESULTS CONSOLE HIV ANTIBODY (ROUTINE TESTING): HIV: NONREACTIVE

## 2017-10-22 LAB — OB RESULTS CONSOLE PLATELET COUNT: Platelets: 310

## 2017-10-22 LAB — CULTURE, OB URINE: URINE CULTURE, OB: NEGATIVE

## 2017-10-22 LAB — OB RESULTS CONSOLE ABO/RH

## 2017-10-22 LAB — OB RESULTS CONSOLE VARICELLA ZOSTER ANTIBODY, IGG: VARICELLA IGG: IMMUNE

## 2017-10-22 LAB — OB RESULTS CONSOLE RPR: RPR: NONREACTIVE

## 2017-10-22 LAB — OB RESULTS CONSOLE HEPATITIS B SURFACE ANTIGEN: HEP B S AG: NEGATIVE

## 2017-10-22 LAB — OB RESULTS CONSOLE RUBELLA ANTIBODY, IGM: Rubella: IMMUNE

## 2017-10-23 ENCOUNTER — Other Ambulatory Visit (HOSPITAL_COMMUNITY): Payer: Self-pay | Admitting: Nurse Practitioner

## 2017-10-23 DIAGNOSIS — Z3A12 12 weeks gestation of pregnancy: Secondary | ICD-10-CM

## 2017-10-23 DIAGNOSIS — O09521 Supervision of elderly multigravida, first trimester: Secondary | ICD-10-CM

## 2017-10-23 DIAGNOSIS — Z3682 Encounter for antenatal screening for nuchal translucency: Secondary | ICD-10-CM

## 2017-11-07 ENCOUNTER — Encounter (HOSPITAL_COMMUNITY): Payer: Self-pay | Admitting: Nurse Practitioner

## 2017-11-14 ENCOUNTER — Encounter (HOSPITAL_COMMUNITY): Payer: Self-pay | Admitting: *Deleted

## 2017-11-16 ENCOUNTER — Encounter (HOSPITAL_COMMUNITY): Payer: Self-pay

## 2017-11-16 ENCOUNTER — Ambulatory Visit (HOSPITAL_COMMUNITY)
Admission: RE | Admit: 2017-11-16 | Discharge: 2017-11-16 | Disposition: A | Discharge status: Home / Self Care | Payer: Medicaid Other | Source: Ambulatory Visit | Attending: Nurse Practitioner | Admitting: Nurse Practitioner

## 2017-11-16 DIAGNOSIS — O09521 Supervision of elderly multigravida, first trimester: Secondary | ICD-10-CM | POA: Insufficient documentation

## 2017-11-16 DIAGNOSIS — Z3682 Encounter for antenatal screening for nuchal translucency: Secondary | ICD-10-CM | POA: Diagnosis not present

## 2017-11-16 DIAGNOSIS — Z3A12 12 weeks gestation of pregnancy: Secondary | ICD-10-CM | POA: Insufficient documentation

## 2017-11-16 DIAGNOSIS — O09529 Supervision of elderly multigravida, unspecified trimester: Secondary | ICD-10-CM

## 2017-11-16 NOTE — Progress Notes (Signed)
Genetic Counseling  High-Risk Gestation Note  Appointment Date:  11/16/2017 Referred By: Trina AoBaker, Sandra K, NP Date of Birth:  02/20/1976   Pregnancy History: [id number] Estimated Date of Delivery: 05/27/18 Estimated Gestational Age: 7238w4d Attending: Particia NearingMartha Decker, MD   Ms. Paige Anthony was seen for genetic counseling because of a maternal age of 41 y.o..     In summary:  Discussed AMA and associated risk for fetal aneuploidy  Discussed options for screening  First screen- declined  Quad screen- declined  NIPS- elected to pursue Panorama today  Ultrasound- see separate report from today's ultrasound  Discussed diagnostic testing options  Amniocentesis- declined  Reviewed family history concerns  Discussed carrier screening options- declined  CF  SMA  Hemoglobinopathies  She was counseled regarding maternal age and the association with risk for chromosome conditions due to nondisjunction with aging of the ova.   We reviewed chromosomes, nondisjunction, and the associated 1 in 22 risk for fetal aneuploidy related to a maternal age of 41 y.o. at 6238w4d gestation.  She was counseled that the risk for aneuploidy decreases as gestational age increases, accounting for those pregnancies which spontaneously abort.  We specifically discussed Down syndrome (trisomy 6721), trisomies 3313 and 7518, and sex chromosome aneuploidies (47,XXX and 47,XXY) including the common features and prognoses of each.   We reviewed available screening options including First Screen, Quad screen, noninvasive prenatal screening (NIPS)/cell free DNA (cfDNA) screening, and detailed ultrasound.  She was counseled that screening tests are used to modify a patient's a priori risk for aneuploidy, typically based on age. This estimate provides a pregnancy specific risk assessment. We reviewed the benefits and limitations of each option. Specifically, we discussed the conditions for which each test screens,  the detection rates, and false positive rates of each. She was also counseled regarding diagnostic testing via [facility] and amniocentesis. We reviewed the approximate 1 in 300-500 risk for complications from amniocentesis, including spontaneous pregnancy loss. We discussed the possible results that the tests might provide including: positive, negative, unanticipated, and no result. Finally, they were counseled regarding the cost of each option and potential out of pocket expenses. After consideration of all the options, she elected to proceed with NIPS ([facility]).  Those results will be available in 8-10 days.    A limited ultrasound was performed today.  The report will be documented separately.  Detailed ultrasound is available to the patient at ~18+ weeks gestation.  She understands that screening tests cannot rule out all birth defects or genetic syndromes. The patient was advised of this limitation and states she still does not want additional testing at this time.    Ms. Paige Anthony was provided with written information regarding cystic fibrosis (CF), spinal muscular atrophy (SMA) and hemoglobinopathies including the carrier frequency, availability of carrier screening and prenatal diagnosis if indicated.  In addition, we discussed that CF and hemoglobinopathies are routinely screened for as part of the [place] newborn screening panel.  She declined screening for CF, SMA and hemoglobinopathies.  Both family histories were reviewed and found to be noncontributory for birth defects, intellectual disability, and known genetic conditions. Consanguinity was denied. Without further information regarding the provided family history, an accurate genetic risk cannot be calculated. Further genetic counseling is warranted if more information is obtained.  Ms. Paige Anthony denied exposure to environmental toxins or chemical agents. She denied the use of alcohol,  tobacco or street drugs. She denied significant viral illnesses during  the course of her pregnancy.  I counseled Ms. Paige Anthony regarding the above risks and available options.  The approximate face-to-face time with the genetic counselor was 45 minutes.  Quinn PlowmanKaren Anaiya Wisinski, MS,  Certified Genetic Counselor 11/16/2017

## 2017-11-23 ENCOUNTER — Telehealth (HOSPITAL_COMMUNITY): Payer: Self-pay | Admitting: MS"

## 2017-11-23 NOTE — Telephone Encounter (Signed)
Attempted to call patient regarding results of Panorama (NIPS), which are within normal limits. Left message for patient to return call.   Clydie BraunKaren Jymir Dunaj  11/23/2017 5:00 PM

## 2017-11-27 ENCOUNTER — Telehealth (HOSPITAL_COMMUNITY): Payer: Self-pay | Admitting: MS"

## 2017-11-27 NOTE — Telephone Encounter (Signed)
Called Paige Anthony to discuss her prenatal cell free DNA test results.  Paige [facility].  Testing was offered because of maternal age.   The patient was identified by name and DOB.  We reviewed that these are within normal limits, showing a less than 1 in 10,000 risk for trisomies 21, 18 and 13, and monosomy X (Turner syndrome).  In addition, the risk for triploidy and sex chromosome trisomies (47,XXX and 47,XXY) was also low risk.  We reviewed that this testing identifies > 99% of pregnancies with trisomy 9521, trisomy 4413, sex chromosome trisomies (47,XXX and 47,XXY), and triploidy. The detection rate for trisomy 18 is 96%.  The detection rate for monosomy X is ~92%.  The false positive rate is <0.1% for all conditions. Testing was also consistent with female fetal sex.  The patient did wish to know fetal sex.  She understands that this testing does not identify all genetic conditions.  All questions were answered to her satisfaction, she was encouraged to call with additional questions or concerns.  Paige PlowmanKaren Gracia Saggese, MS Certified Genetic Counselor 11/27/2017  2:13 PM

## 2017-11-28 ENCOUNTER — Other Ambulatory Visit (HOSPITAL_COMMUNITY): Payer: Self-pay

## 2017-12-18 NOTE — L&D Delivery Note (Signed)
Delivery Note After 2 pushes At 7:33 PM a viable female was delivered via Vaginal, Spontaneous (Presentation: ;  ).  APGAR: 8, 9; weight pending  . After 1 minute, the cord was clamped and cut. 40 units of pitocin diluted in 1000cc LR was infused rapidly IV.  The placenta separated spontaneously and delivered via CCT and maternal pushing effort.  It was inspected and appears to be intact with a 3 VC.    Anesthesia:  epidural Episiotomy: None Lacerations: None Suture Repair:  Est. Blood Loss (mL): 25  Mom to postpartum.  Baby to Couplet care / Skin to Skin.  Paige Anthony 05/09/2018, 7:50 PM

## 2018-03-14 LAB — OB RESULTS CONSOLE RPR: RPR: NONREACTIVE

## 2018-03-14 LAB — GLUCOSE, 1 HOUR: Glucose 1 Hour: 218

## 2018-03-18 ENCOUNTER — Encounter: Payer: Self-pay | Admitting: *Deleted

## 2018-03-19 ENCOUNTER — Encounter: Payer: Medicaid Other | Attending: Medical | Admitting: *Deleted

## 2018-03-19 ENCOUNTER — Ambulatory Visit: Payer: Self-pay | Admitting: *Deleted

## 2018-03-19 DIAGNOSIS — R7302 Impaired glucose tolerance (oral): Secondary | ICD-10-CM | POA: Diagnosis present

## 2018-03-19 DIAGNOSIS — O0990 Supervision of high risk pregnancy, unspecified, unspecified trimester: Secondary | ICD-10-CM | POA: Insufficient documentation

## 2018-03-19 DIAGNOSIS — Z713 Dietary counseling and surveillance: Secondary | ICD-10-CM | POA: Insufficient documentation

## 2018-03-19 LAB — CYTOLOGY - PAP: Pap: NEGATIVE

## 2018-03-19 NOTE — Progress Notes (Signed)
  Patient was seen on 03/19/2018 for Gestational Diabetes self-management. EDD 05/27/2018. Patient states no history of GDM. Diet history obtained. Patient eats good variety of all food groups and beverages include only water. She states she is no longer working and is home during the day. The following learning objectives were met by the patient :   States the definition of Gestational Diabetes  States why dietary management is important in controlling blood glucose  Describes the effects of carbohydrates on blood glucose levels  Demonstrates ability to create a balanced meal plan  Demonstrates carbohydrate counting   States when to check blood glucose levels  Demonstrates proper blood glucose monitoring techniques  States the effect of stress and exercise on blood glucose levels  States the importance of limiting caffeine and abstaining from alcohol and smoking  Plan:  Aim for 3 Carb Choices per meal (45 grams) +/- 1 either way  Aim for 1-2 Carbs per snack Begin reading food labels for Total Carbohydrate of foods Consider  increasing your activity level by walking or other activity daily as tolerated Begin checking BG before breakfast and 2 hours after first bite of breakfast, lunch and dinner as directed by MD  Bring Log Book/Sheet to every medical appointment   Patient was introduced to [employer] and plans to use as record of BG electronically and states she will also record BG on Log Sheet Take medication if directed by MD  Blood glucose monitor given: True Trac Blood glucose reading: 140 mg/dl post breakfast  Patient instructed to monitor glucose levels: FBS: 60 - 95 mg/dl 2 hour: <120 mg/dl  Patient received the following handouts:provided in Spanish although she speaks [place] well   Nutrition Diabetes and Pregnancy  Carbohydrate Counting List  Patient will be seen for follow-up as needed.

## 2018-03-25 ENCOUNTER — Encounter: Payer: Medicaid Other | Admitting: Obstetrics & Gynecology

## 2018-03-25 ENCOUNTER — Encounter: Payer: Self-pay | Admitting: Obstetrics & Gynecology

## 2018-03-25 ENCOUNTER — Ambulatory Visit (INDEPENDENT_AMBULATORY_CARE_PROVIDER_SITE_OTHER): Payer: Self-pay | Admitting: Obstetrics & Gynecology

## 2018-03-25 DIAGNOSIS — O24419 Gestational diabetes mellitus in pregnancy, unspecified control: Secondary | ICD-10-CM

## 2018-03-25 DIAGNOSIS — O0990 Supervision of high risk pregnancy, unspecified, unspecified trimester: Secondary | ICD-10-CM

## 2018-03-25 DIAGNOSIS — O0943 Supervision of pregnancy with grand multiparity, third trimester: Secondary | ICD-10-CM

## 2018-03-25 DIAGNOSIS — O0993 Supervision of high risk pregnancy, unspecified, third trimester: Secondary | ICD-10-CM

## 2018-03-25 DIAGNOSIS — O0940 Supervision of pregnancy with grand multiparity, unspecified trimester: Secondary | ICD-10-CM | POA: Insufficient documentation

## 2018-03-25 LAB — POCT URINALYSIS DIP (DEVICE)
BILIRUBIN URINE: NEGATIVE
GLUCOSE, UA: 250 mg/dL — AB
Hgb urine dipstick: NEGATIVE
KETONES UR: NEGATIVE mg/dL
NITRITE: NEGATIVE
PH: 6.5 (ref 5.0–8.0)
Protein, ur: NEGATIVE mg/dL
Specific Gravity, Urine: 1.02 (ref 1.005–1.030)
Urobilinogen, UA: 0.2 mg/dL (ref 0.0–1.0)

## 2018-03-25 MED ORDER — METFORMIN HCL 500 MG PO TABS: 500.0000 mg | ORAL_TABLET | Freq: Every day | ORAL | 2 refills | Status: DC

## 2018-03-25 NOTE — Progress Notes (Signed)
  Subjective:transfer from HD for diabetes    Paige Anthony is a [id number] 5768w0d being seen today for her first obstetrical visit.  Her obstetrical history is significant for gestational diabetes. Patient does intend to breast feed. Pregnancy history fully reviewed.  Patient reports no complaints.  Vitals:   03/25/18 1523  BP: 118/70  Pulse: 81  Weight: 267 lb 9.6 oz (121.4 kg)    HISTORY: OB History  Gravida Para Term Preterm AB Living  5 4 4     4   SAB TAB Ectopic Multiple Live Births          4    # Outcome Date GA Lbr Len/2nd Weight Sex Delivery Anes PTL Lv  5 Current           4 Term 08/06/11 377w3d 06:53 / 00:07 6 lb 15.8 oz (3.17 kg) M Vag-Spont EPI  LIV  3 Term 01/22/05 5262w0d  8 lb 6 oz (3.799 kg) M Vag-Spont EPI  LIV  2 Term 04/29/01 5873w6d  7 lb 4.4 oz (3.3 kg) M Vag-Spont EPI  LIV  1 Term 07/13/98 8162w0d  6 lb 4 oz (2.835 kg) M Vag-Spont   LIV   Past Medical History:  Diagnosis Date  . Asthma   . Chlamydia   . Gestational diabetes   . Hypertension   . UTI (lower urinary tract infection)    History reviewed. No pertinent surgical history. History reviewed. No pertinent family history.   Exam    Uterus:     Pelvic Exam:                               System: Breast:     Skin: normal coloration and turgor, no rashes    Neurologic: oriented, normal mood   Extremities: normal strength, tone, and muscle mass   HEENT PERRLA, extra ocular movement intact and sclera clear, anicteric   Mouth/Teeth dental hygiene good   Neck supple   Cardiovascular: regular rate and rhythm   Respiratory:  appears well, vitals normal, no respiratory distress, acyanotic, normal RR   Abdomen: obese, gravid   Urinary:        Assessment:    Pregnancy: [id number] Patient Active Problem List   Diagnosis Date Noted  . Gestational diabetes mellitus (GDM) affecting fifth pregnancy 03/25/2018  . Supervision of high risk pregnancy, antepartum 03/19/2018  . Advanced  maternal age in multigravida 11/16/2017        Plan:     Initial labs drawn. Prenatal vitamins. Problem list reviewed and updated. Genetic Screening discussed Panorama normal results reviewed.  Ultrasound discussed; fetal survey: results reviewed.  Follow up in 1 weeks. 50% of 30 min visit spent on counseling and coordination of care.  Metformin started due to elevated FBS   Scheryl DarterJames Arnold 03/25/2018

## 2018-03-25 NOTE — Progress Notes (Signed)
Patient ID: Paige Anthony, female   DOB: 07/23/1976, 42 y.o.   MRN: [id number] Patient was informed of her next appointment which would be 04/15 in the afternoon. Patient stated she could not do afternoon's because she has to pick her child up from school. I informed the patient I would not have anything in the morning until that Wednesday. She said that would have to work because she has to pick up her son. I informed her sometimes we may have to give her afternoon appointments if there are no more mornings available.

## 2018-03-25 NOTE — Patient Instructions (Signed)

## 2018-03-28 ENCOUNTER — Encounter: Payer: Self-pay | Admitting: *Deleted

## 2018-04-01 ENCOUNTER — Other Ambulatory Visit: Payer: Self-pay

## 2018-04-02 ENCOUNTER — Encounter: Payer: Self-pay | Admitting: *Deleted

## 2018-04-03 ENCOUNTER — Ambulatory Visit (INDEPENDENT_AMBULATORY_CARE_PROVIDER_SITE_OTHER): Payer: Medicaid Other | Admitting: *Deleted

## 2018-04-03 ENCOUNTER — Ambulatory Visit (INDEPENDENT_AMBULATORY_CARE_PROVIDER_SITE_OTHER): Payer: Self-pay | Admitting: Obstetrics and Gynecology

## 2018-04-03 ENCOUNTER — Encounter: Payer: Self-pay | Admitting: Obstetrics and Gynecology

## 2018-04-03 ENCOUNTER — Ambulatory Visit: Payer: Self-pay

## 2018-04-03 VITALS — BP 130/68 | HR 86 | Wt 267.3 lb

## 2018-04-03 DIAGNOSIS — O0940 Supervision of pregnancy with grand multiparity, unspecified trimester: Secondary | ICD-10-CM

## 2018-04-03 DIAGNOSIS — O0990 Supervision of high risk pregnancy, unspecified, unspecified trimester: Secondary | ICD-10-CM

## 2018-04-03 DIAGNOSIS — O09529 Supervision of elderly multigravida, unspecified trimester: Secondary | ICD-10-CM

## 2018-04-03 DIAGNOSIS — O24419 Gestational diabetes mellitus in pregnancy, unspecified control: Principal | ICD-10-CM

## 2018-04-03 NOTE — Progress Notes (Signed)

## 2018-04-03 NOTE — Progress Notes (Signed)
Pt states she has only taken Metformin 3 times since it was prescribed because it made her feel dizzy. She took it at bedtime (not w/meal @ supper) and has not taken for 2 days.

## 2018-04-03 NOTE — Progress Notes (Signed)
   PRENATAL VISIT NOTE  Subjective:  Paige Anthony is a 42 y.o. G5P4004 at 3221w2d being seen today for ongoing prenatal care.  She is currently monitored for the following issues for this high-risk pregnancy and has Advanced maternal age in multigravida; Supervision of high risk pregnancy, antepartum; and Gestational diabetes mellitus (GDM) affecting fifth pregnancy on their problem list.  Patient reports occasional contractions.  Contractions: Irregular. Vag. Bleeding: None.  Movement: Present. Denies leaking of fluid.   The following portions of the patient's history were reviewed and updated as appropriate: allergies, current medications, past family history, past medical history, past social history, past surgical history and problem list. Problem list updated.  Objective:   Vitals:   04/03/18 1005  BP: 130/68  Pulse: 86  Weight: 267 lb 4.8 oz (121.2 kg)    Fetal Status: Fetal Heart Rate (bpm): NST   Movement: Present     General:  Alert, oriented and cooperative. Patient is in no acute distress.  Skin: Skin is warm and dry. No rash noted.   Cardiovascular: Normal heart rate noted  Respiratory: Normal respiratory effort, no problems with respiration noted  Abdomen: Soft, gravid, appropriate for gestational age.  Pain/Pressure: Present     Pelvic: Cervical exam deferred        Extremities: Normal range of motion.  Edema: None  Mental Status: Normal mood and affect. Normal behavior. Normal judgment and thought content.   Assessment and Plan:  Pregnancy: G5P4004 at 3221w2d  1. Gestational diabetes mellitus (GDM) affecting fifth pregnancy FG 100s-111, PP 100s-130s Started on metformin last visit but only took it 3 times due to some dizziness, reviewed that she should take it with dinner and that dizziness will improve once she has been on it for a little while, she will start taking it Weekly testing starting this week Reactive NST  BPP 10/10 today  2. Antepartum  multigravida of advanced maternal age Weekly testing starting 36 weeks (already started for GDM)  3. Supervision of high risk pregnancy, antepartum   Preterm labor symptoms and general obstetric precautions including but not limited to vaginal bleeding, contractions, leaking of fluid and fetal movement were reviewed in detail with the patient. Please refer to After Visit Summary for other counseling recommendations.  Return in about 1 week (around 04/10/2018) for OB visit (MD).  No future appointments.  Conan BowensKelly M Davis, MD

## 2018-04-10 ENCOUNTER — Ambulatory Visit: Payer: Self-pay

## 2018-04-10 ENCOUNTER — Ambulatory Visit (INDEPENDENT_AMBULATORY_CARE_PROVIDER_SITE_OTHER): Payer: Medicaid Other | Admitting: *Deleted

## 2018-04-10 VITALS — BP 126/68 | HR 89

## 2018-04-10 DIAGNOSIS — O0943 Supervision of pregnancy with grand multiparity, third trimester: Secondary | ICD-10-CM

## 2018-04-10 DIAGNOSIS — O0940 Supervision of pregnancy with grand multiparity, unspecified trimester: Secondary | ICD-10-CM

## 2018-04-10 DIAGNOSIS — O24419 Gestational diabetes mellitus in pregnancy, unspecified control: Secondary | ICD-10-CM | POA: Diagnosis present

## 2018-04-10 NOTE — Progress Notes (Signed)
I have reviewed this chart and agree with the RN/CMA assessment and management.    K. Meryl Davis, M.D. Attending Obstetrician & Gynecologist, [facility], [facility]  

## 2018-04-10 NOTE — Progress Notes (Signed)

## 2018-04-17 ENCOUNTER — Ambulatory Visit: Payer: Self-pay

## 2018-04-17 ENCOUNTER — Ambulatory Visit (INDEPENDENT_AMBULATORY_CARE_PROVIDER_SITE_OTHER): Payer: Medicaid Other | Admitting: General Practice

## 2018-04-17 ENCOUNTER — Ambulatory Visit (INDEPENDENT_AMBULATORY_CARE_PROVIDER_SITE_OTHER): Payer: Self-pay | Admitting: Obstetrics & Gynecology

## 2018-04-17 VITALS — BP 114/57 | HR 88 | Wt 267.0 lb

## 2018-04-17 DIAGNOSIS — O0943 Supervision of pregnancy with grand multiparity, third trimester: Secondary | ICD-10-CM | POA: Diagnosis not present

## 2018-04-17 DIAGNOSIS — O09523 Supervision of elderly multigravida, third trimester: Secondary | ICD-10-CM

## 2018-04-17 DIAGNOSIS — O24419 Gestational diabetes mellitus in pregnancy, unspecified control: Principal | ICD-10-CM

## 2018-04-17 DIAGNOSIS — O0940 Supervision of pregnancy with grand multiparity, unspecified trimester: Secondary | ICD-10-CM

## 2018-04-17 DIAGNOSIS — O0993 Supervision of high risk pregnancy, unspecified, third trimester: Secondary | ICD-10-CM | POA: Diagnosis not present

## 2018-04-17 DIAGNOSIS — O0990 Supervision of high risk pregnancy, unspecified, unspecified trimester: Secondary | ICD-10-CM

## 2018-04-17 LAB — POCT URINALYSIS DIP (DEVICE)
BILIRUBIN URINE: NEGATIVE
Glucose, UA: >=1000 mg/dL — AB
HGB URINE DIPSTICK: NEGATIVE
Ketones, ur: 15 mg/dL — AB
Nitrite: NEGATIVE
PH: 6 (ref 5.0–8.0)
PROTEIN: NEGATIVE mg/dL
Specific Gravity, Urine: 1.02 (ref 1.005–1.030)
Urobilinogen, UA: 1 mg/dL (ref 0.0–1.0)

## 2018-04-17 MED ORDER — METFORMIN HCL 500 MG PO TABS: ORAL_TABLET | ORAL | 2 refills | Status: DC

## 2018-04-17 NOTE — Progress Notes (Signed)
   PRENATAL VISIT NOTE  Subjective:  Paige Anthony is a 42 y.o. G5P4004 at [redacted]w[redacted]d being seen today for ongoing prenatal care.  She is currently monitored for the following issues for this high-risk pregnancy and has Advanced maternal age in multigravida; Supervision of high risk pregnancy, antepartum; and Gestational diabetes mellitus (GDM) affecting fifth pregnancy on their problem list.  Patient reports no complaints.  Contractions: Not present. Vag. Bleeding: None.  Movement: Present. Denies leaking of fluid.   The following portions of the patient's history were reviewed and updated as appropriate: allergies, current medications, past family history, past medical history, past social history, past surgical history and problem list. Problem list updated.  Objective:   Vitals:   04/17/18 1452  BP: (!) 114/57  Pulse: 88  Weight: 267 lb (121.1 kg)    Fetal Status: Fetal Heart Rate (bpm): NST   Movement: Present     General:  Alert, oriented and cooperative. Patient is in no acute distress.  Skin: Skin is warm and dry. No rash noted.   Cardiovascular: Normal heart rate noted  Respiratory: Normal respiratory effort, no problems with respiration noted  Abdomen: Soft, gravid, appropriate for gestational age.  Pain/Pressure: Present     Pelvic: Cervical exam deferred        Extremities: Normal range of motion.  Edema: None  Mental Status: Normal mood and affect. Normal behavior. Normal judgment and thought content.   Assessment and Plan:  Pregnancy: G5P4004 at [redacted]w[redacted]d  1. Gestational diabetes mellitus (GDM) affecting fifth pregnancy - fasting sugar all around 100-120, 2 hours also too high - metFORMIN (GLUCOPHAGE) 500 MG tablet; Take 2 tablets at bedtime and 1 tablet with breakfast  Dispense: 60 tablet; Refill: 2  2. Multigravida of advanced maternal age in third trimester   3. Supervision of high risk pregnancy, antepartum - continue weekly BPP until delivery -  metFORMIN (GLUCOPHAGE) 500 MG tablet; Take 2 tablets at bedtime and 1 tablet with breakfast  Dispense: 60 tablet; Refill: 2  Term labor symptoms and general obstetric precautions including but not limited to vaginal bleeding, contractions, leaking of fluid and fetal movement were reviewed in detail with the patient. Please refer to After Visit Summary for other counseling recommendations.  Return in about 1 week (around 04/24/2018) for for BPP/NST.  Future Appointments  Date Time Provider Department Center  04/17/2018  3:35 PM [facility]  04/24/2018 10:15 AM [facility] NST [facility]  05/01/2018 10:15 AM [facility] NST [facility]  05/01/2018 11:15 AM Allie Bossier, MD [facility]  05/08/2018 10:15 AM [facility] NST [facility]  05/08/2018 11:15 AM Reva Bores, MD [facility]  05/14/2018  9:00 AM Lucie Leather, Alvira Philips, MD [facility] None  05/15/2018 10:15 AM [facility] NST [facility]  05/15/2018 11:15 AM Allie Bossier, MD [facility]    Allie Bossier, MD

## 2018-04-17 NOTE — Progress Notes (Signed)
Pt informed that the ultrasound is considered a limited OB ultrasound and is not intended to be a complete ultrasound exam.  Patient also informed that the ultrasound is not being completed with the intent of assessing for fetal or placental anomalies or any pelvic abnormalities.  Explained that the purpose of today's ultrasound is to assess for  BPP, presentation and AFI.  Patient acknowledges the purpose of the exam and the limitations of the study.    

## 2018-04-18 NOTE — Progress Notes (Signed)
I have reviewed this chart and agree with the RN/CMA assessment and management.    Arther Heisler C Lige Lakeman, MD, FACOG Attending Physician, Faculty Practice [facility]  

## 2018-04-24 ENCOUNTER — Ambulatory Visit (INDEPENDENT_AMBULATORY_CARE_PROVIDER_SITE_OTHER): Payer: Medicaid Other | Admitting: *Deleted

## 2018-04-24 ENCOUNTER — Ambulatory Visit: Payer: Self-pay

## 2018-04-24 VITALS — BP 121/62 | HR 90

## 2018-04-24 DIAGNOSIS — O0943 Supervision of pregnancy with grand multiparity, third trimester: Secondary | ICD-10-CM

## 2018-04-24 DIAGNOSIS — O24419 Gestational diabetes mellitus in pregnancy, unspecified control: Secondary | ICD-10-CM

## 2018-04-24 DIAGNOSIS — O09523 Supervision of elderly multigravida, third trimester: Secondary | ICD-10-CM

## 2018-04-24 DIAGNOSIS — O0940 Supervision of pregnancy with grand multiparity, unspecified trimester: Secondary | ICD-10-CM

## 2018-04-24 NOTE — Progress Notes (Signed)

## 2018-05-01 ENCOUNTER — Ambulatory Visit (INDEPENDENT_AMBULATORY_CARE_PROVIDER_SITE_OTHER): Payer: Self-pay | Admitting: Obstetrics & Gynecology

## 2018-05-01 ENCOUNTER — Ambulatory Visit: Payer: Self-pay

## 2018-05-01 ENCOUNTER — Ambulatory Visit (INDEPENDENT_AMBULATORY_CARE_PROVIDER_SITE_OTHER): Payer: Medicaid Other | Admitting: *Deleted

## 2018-05-01 VITALS — BP 133/65 | HR 82 | Wt 268.6 lb

## 2018-05-01 DIAGNOSIS — O0940 Supervision of pregnancy with grand multiparity, unspecified trimester: Secondary | ICD-10-CM

## 2018-05-01 DIAGNOSIS — O0943 Supervision of pregnancy with grand multiparity, third trimester: Secondary | ICD-10-CM | POA: Diagnosis not present

## 2018-05-01 DIAGNOSIS — O24419 Gestational diabetes mellitus in pregnancy, unspecified control: Secondary | ICD-10-CM

## 2018-05-01 DIAGNOSIS — O09523 Supervision of elderly multigravida, third trimester: Secondary | ICD-10-CM

## 2018-05-01 DIAGNOSIS — O0990 Supervision of high risk pregnancy, unspecified, unspecified trimester: Secondary | ICD-10-CM

## 2018-05-01 DIAGNOSIS — Z113 Encounter for screening for infections with a predominantly sexual mode of transmission: Secondary | ICD-10-CM

## 2018-05-01 LAB — OB RESULTS CONSOLE GC/CHLAMYDIA: GC PROBE AMP, GENITAL: NEGATIVE

## 2018-05-01 MED ORDER — GLYBURIDE 2.5 MG PO TABS: ORAL_TABLET | ORAL | 3 refills | Status: DC

## 2018-05-01 NOTE — Progress Notes (Signed)
Pt reports increased sciatic pain on Rt side.

## 2018-05-01 NOTE — Progress Notes (Signed)
   PRENATAL VISIT NOTE  Subjective:  Paige Anthony is a 42 y.o. G5P4004 at [redacted]w[redacted]d being seen today for ongoing prenatal care.  She is currently monitored for the following issues for this high-risk pregnancy and has Advanced maternal age in multigravida; Supervision of high risk pregnancy, antepartum; and Gestational diabetes mellitus (GDM) affecting fifth pregnancy on their problem list.  Patient reports right sciatica pain.  Contractions: Not present. Vag. Bleeding: None.  Movement: Present. Denies leaking of fluid.   The following portions of the patient's history were reviewed and updated as appropriate: allergies, current medications, past family history, past medical history, past social history, past surgical history and problem list. Problem list updated.  Objective:   Vitals:   05/01/18 1043  BP: 133/65  Pulse: 82  Weight: 268 lb 9.6 oz (121.8 kg)    Fetal Status: Fetal Heart Rate (bpm): NST   Movement: Present     General:  Alert, oriented and cooperative. Patient is in no acute distress.  Skin: Skin is warm and dry. No rash noted.   Cardiovascular: Normal heart rate noted  Respiratory: Normal respiratory effort, no problems with respiration noted  Abdomen: Soft, gravid, appropriate for gestational age.  Pain/Pressure: Present     Pelvic: Cervical exam performed       closed/50%/high  Extremities: Normal range of motion.  Edema: None  Mental Status: Normal mood and affect. Normal behavior. Normal judgment and thought content.   Assessment and Plan:  Pregnancy: G5P4004 at [redacted]w[redacted]d  1. Supervision of high risk pregnancy, antepartum - GC/Chlamydia probe amp ()not at [facility] - Strep Gp B NAA  2. Gestational diabetes mellitus (GDM) affecting fifth pregnancy - her fastings have been greater than 100 for weeks She is already taking metformin  at night and 500 mg in the morning I have added glyburide 2.5 mg at night - I have ordered a MFM u/s for  growth  3. Multigravida of advanced maternal age in third trimester   Preterm labor symptoms and general obstetric precautions including but not limited to vaginal bleeding, contractions, leaking of fluid and fetal movement were reviewed in detail with the patient. Please refer to After Visit Summary for other counseling recommendations.  Return in about 1 week (around 05/08/2018) for weekly as scheduled.  Future Appointments  Date Time Provider Department Center  05/08/2018 10:15 AM [facility] NST [facility]  05/08/2018 11:15 AM Reva Bores, MD [facility]  05/14/2018  9:00 AM Lucie Leather, Alvira Philips, MD [facility] None  05/15/2018 10:15 AM [facility] NST [facility]  05/15/2018 11:15 AM Allie Bossier, MD [facility]    Allie Bossier, MD

## 2018-05-01 NOTE — Progress Notes (Signed)

## 2018-05-02 LAB — GC/CHLAMYDIA PROBE AMP (~~LOC~~) NOT AT ARMC
Chlamydia: NEGATIVE
Neisseria Gonorrhea: NEGATIVE

## 2018-05-02 NOTE — Progress Notes (Signed)
I have reviewed this chart and agree with the RN/CMA assessment and management.    Aarron Wierzbicki C Aurelia Gras, MD, FACOG Attending Physician, Faculty [facility]  

## 2018-05-03 LAB — STREP GP B NAA: STREP GROUP B AG: NEGATIVE

## 2018-05-03 LAB — OB RESULTS CONSOLE GBS: GBS: NEGATIVE

## 2018-05-08 ENCOUNTER — Inpatient Hospital Stay (HOSPITAL_COMMUNITY)
Admission: AD | Admit: 2018-05-08 | Discharge: 2018-05-11 | DRG: 807 | Disposition: A | Discharge status: Home / Self Care | Payer: Medicaid Other | Source: Ambulatory Visit | Attending: Obstetrics & Gynecology | Admitting: Obstetrics & Gynecology

## 2018-05-08 ENCOUNTER — Other Ambulatory Visit: Payer: Self-pay | Admitting: Obstetrics & Gynecology

## 2018-05-08 ENCOUNTER — Ambulatory Visit (HOSPITAL_COMMUNITY)
Admission: RE | Admit: 2018-05-08 | Discharge: 2018-05-08 | Disposition: A | Discharge status: Home / Self Care | Payer: Medicaid Other | Source: Ambulatory Visit | Attending: Obstetrics & Gynecology | Admitting: Obstetrics & Gynecology

## 2018-05-08 ENCOUNTER — Other Ambulatory Visit: Payer: Self-pay

## 2018-05-08 ENCOUNTER — Other Ambulatory Visit: Payer: Self-pay | Admitting: Family Medicine

## 2018-05-08 ENCOUNTER — Encounter (HOSPITAL_COMMUNITY): Payer: Self-pay

## 2018-05-08 ENCOUNTER — Encounter: Payer: Self-pay | Admitting: Family Medicine

## 2018-05-08 ENCOUNTER — Ambulatory Visit (INDEPENDENT_AMBULATORY_CARE_PROVIDER_SITE_OTHER): Payer: Medicaid Other | Admitting: Family Medicine

## 2018-05-08 ENCOUNTER — Ambulatory Visit (INDEPENDENT_AMBULATORY_CARE_PROVIDER_SITE_OTHER): Payer: Medicaid Other | Admitting: *Deleted

## 2018-05-08 VITALS — BP 126/78 | HR 87 | Wt 274.2 lb

## 2018-05-08 DIAGNOSIS — O24415 Gestational diabetes mellitus in pregnancy, controlled by oral hypoglycemic drugs: Secondary | ICD-10-CM | POA: Diagnosis not present

## 2018-05-08 DIAGNOSIS — O0990 Supervision of high risk pregnancy, unspecified, unspecified trimester: Secondary | ICD-10-CM

## 2018-05-08 DIAGNOSIS — O0940 Supervision of pregnancy with grand multiparity, unspecified trimester: Secondary | ICD-10-CM

## 2018-05-08 DIAGNOSIS — O24425 Gestational diabetes mellitus in childbirth, controlled by oral hypoglycemic drugs: Secondary | ICD-10-CM | POA: Diagnosis present

## 2018-05-08 DIAGNOSIS — O24419 Gestational diabetes mellitus in pregnancy, unspecified control: Secondary | ICD-10-CM | POA: Diagnosis not present

## 2018-05-08 DIAGNOSIS — J45909 Unspecified asthma, uncomplicated: Secondary | ICD-10-CM | POA: Diagnosis present

## 2018-05-08 DIAGNOSIS — O0943 Supervision of pregnancy with grand multiparity, third trimester: Secondary | ICD-10-CM

## 2018-05-08 DIAGNOSIS — O99213 Obesity complicating pregnancy, third trimester: Secondary | ICD-10-CM | POA: Insufficient documentation

## 2018-05-08 DIAGNOSIS — Z91040 Latex allergy status: Secondary | ICD-10-CM | POA: Diagnosis not present

## 2018-05-08 DIAGNOSIS — O358XX0 Maternal care for other (suspected) fetal abnormality and damage, not applicable or unspecified: Secondary | ICD-10-CM | POA: Diagnosis present

## 2018-05-08 DIAGNOSIS — Z3A37 37 weeks gestation of pregnancy: Secondary | ICD-10-CM

## 2018-05-08 DIAGNOSIS — O09523 Supervision of elderly multigravida, third trimester: Secondary | ICD-10-CM

## 2018-05-08 DIAGNOSIS — O3663X0 Maternal care for excessive fetal growth, third trimester, not applicable or unspecified: Secondary | ICD-10-CM | POA: Diagnosis present

## 2018-05-08 DIAGNOSIS — K219 Gastro-esophageal reflux disease without esophagitis: Secondary | ICD-10-CM

## 2018-05-08 DIAGNOSIS — O9952 Diseases of the respiratory system complicating childbirth: Secondary | ICD-10-CM | POA: Diagnosis present

## 2018-05-08 DIAGNOSIS — E669 Obesity, unspecified: Secondary | ICD-10-CM | POA: Diagnosis present

## 2018-05-08 DIAGNOSIS — Z363 Encounter for antenatal screening for malformations: Secondary | ICD-10-CM

## 2018-05-08 DIAGNOSIS — O99214 Obesity complicating childbirth: Secondary | ICD-10-CM | POA: Diagnosis present

## 2018-05-08 DIAGNOSIS — O09529 Supervision of elderly multigravida, unspecified trimester: Secondary | ICD-10-CM

## 2018-05-08 DIAGNOSIS — O0993 Supervision of high risk pregnancy, unspecified, third trimester: Secondary | ICD-10-CM

## 2018-05-08 LAB — CBC
HEMATOCRIT: 37.8 % (ref 36.0–46.0)
Hemoglobin: 12.4 g/dL (ref 12.0–15.0)
MCH: 28.8 pg (ref 26.0–34.0)
MCHC: 32.8 g/dL (ref 30.0–36.0)
MCV: 87.7 fL (ref 78.0–100.0)
PLATELETS: 248 10*3/uL (ref 150–400)
RBC: 4.31 MIL/uL (ref 3.87–5.11)
RDW: 13.7 % (ref 11.5–15.5)
WBC: 11.1 10*3/uL — ABNORMAL HIGH (ref 4.0–10.5)

## 2018-05-08 LAB — TYPE AND SCREEN
ABO/RH(D): O POS
ANTIBODY SCREEN: NEGATIVE

## 2018-05-08 LAB — GLUCOSE, CAPILLARY: Glucose-Capillary: 109 mg/dL — ABNORMAL HIGH (ref 65–99)

## 2018-05-08 MED ORDER — OXYCODONE-ACETAMINOPHEN 5-325 MG PO TABS: 2.0000 | ORAL_TABLET | ORAL | Status: DC | PRN

## 2018-05-08 MED ORDER — ZOLPIDEM TARTRATE 5 MG PO TABS: 5.0000 mg | ORAL_TABLET | Freq: Every evening | ORAL | Status: DC | PRN

## 2018-05-08 MED ORDER — OXYTOCIN BOLUS FROM INFUSION
500.0000 mL | Freq: Once | INTRAVENOUS | Status: AC
  Administered 2018-05-09: 500 mL via INTRAVENOUS

## 2018-05-08 MED ORDER — MISOPROSTOL 50MCG HALF TABLET
50.0000 ug | ORAL_TABLET | ORAL | Status: DC | PRN
  Administered 2018-05-08 – 2018-05-09 (×3): 50 ug via BUCCAL
  Filled 2018-05-08 (×4): qty 1

## 2018-05-08 MED ORDER — GLYBURIDE 2.5 MG PO TABS: ORAL_TABLET | ORAL | 3 refills | Status: DC

## 2018-05-08 MED ORDER — ACETAMINOPHEN 325 MG PO TABS: 650.0000 mg | ORAL_TABLET | ORAL | Status: DC | PRN

## 2018-05-08 MED ORDER — LIDOCAINE HCL (PF) 1 % IJ SOLN
30.0000 mL | INTRAMUSCULAR | Status: DC | PRN
  Filled 2018-05-08: qty 30

## 2018-05-08 MED ORDER — LACTATED RINGERS IV SOLN: 500.0000 mL | INTRAVENOUS | Status: DC | PRN

## 2018-05-08 MED ORDER — TERBUTALINE SULFATE 1 MG/ML IJ SOLN
0.2500 mg | Freq: Once | INTRAMUSCULAR | Status: DC | PRN
  Filled 2018-05-08: qty 1

## 2018-05-08 MED ORDER — ONDANSETRON HCL 4 MG/2ML IJ SOLN: 4.0000 mg | Freq: Four times a day (QID) | INTRAMUSCULAR | Status: DC | PRN

## 2018-05-08 MED ORDER — OMEPRAZOLE MAGNESIUM 20 MG PO TBEC: 20.0000 mg | DELAYED_RELEASE_TABLET | Freq: Every day | ORAL | 3 refills | Status: DC

## 2018-05-08 MED ORDER — LIDOCAINE HCL (PF) 1 % IJ SOLN
INTRAMUSCULAR | Status: AC
Start: 2018-05-08 — End: ?
  Filled 2018-05-08: qty 5

## 2018-05-08 MED ORDER — SOD CITRATE-CITRIC ACID 500-334 MG/5ML PO SOLN: 30.0000 mL | ORAL | Status: DC | PRN

## 2018-05-08 MED ORDER — OXYCODONE-ACETAMINOPHEN 5-325 MG PO TABS: 1.0000 | ORAL_TABLET | ORAL | Status: DC | PRN

## 2018-05-08 MED ORDER — METFORMIN HCL 500 MG PO TABS
1000.0000 mg | ORAL_TABLET | Freq: Every day | ORAL | Status: DC
Start: 2018-05-08 — End: 2018-05-09
  Administered 2018-05-08: 1000 mg via ORAL
  Filled 2018-05-08: qty 2

## 2018-05-08 MED ORDER — GLYBURIDE 2.5 MG PO TABS
2.5000 mg | ORAL_TABLET | Freq: Every day | ORAL | Status: DC
  Administered 2018-05-08: 2.5 mg via ORAL
  Filled 2018-05-08: qty 1

## 2018-05-08 MED ORDER — LACTATED RINGERS IV SOLN
INTRAVENOUS | Status: DC
  Administered 2018-05-08 – 2018-05-09 (×3): via INTRAVENOUS

## 2018-05-08 MED ORDER — METFORMIN HCL 500 MG PO TABS
500.0000 mg | ORAL_TABLET | Freq: Every day | ORAL | Status: DC
  Filled 2018-05-08: qty 1

## 2018-05-08 MED ORDER — OXYTOCIN 40 UNITS IN LACTATED RINGERS INFUSION - SIMPLE MED: 2.5000 [IU]/h | INTRAVENOUS | Status: DC

## 2018-05-08 NOTE — H&P (Signed)
Paige Anthony is a 42 y.o. 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 Preg has been followed by the [facility] service since 31wks when she tx from [facility] and has been remarkable for 1) AMA 2) GDMA2 (metformin & glyburide) 3) obesity. During her growth scan today EFW was noted to be 4291gm.  OB History    Gravida  5   Para  4   Term  4   Preterm      AB      Living  4     SAB      TAB      Ectopic      Multiple      Live Births  4          Past Medical History:  Diagnosis Date  . Asthma   . Chlamydia   . Gestational diabetes   . Hypertension   . UTI (lower urinary tract infection)    History reviewed. No pertinent surgical history. Family History: family history is not on file. Social History:  reports that she has never smoked. She has never used smokeless tobacco. She reports that she does not drink alcohol or use drugs.     Maternal Diabetes: Yes:  Diabetes Type:  Insulin/Medication controlled Genetic Screening: Normal Maternal Ultrasounds/Referrals: Abnormal:  Findings:   Other:umb vein varix; LGA Fetal Ultrasounds or other Referrals:  Referred to Materal Fetal Medicine  Maternal Substance Abuse:  No Significant Maternal Medications:  None Significant Maternal Lab Results:  Lab values include: Group B Strep negative Other Comments:  None  ROS History   Blood pressure 138/87, pulse 91, temperature 98.2 F (36.8 C), temperature source Oral, resp. rate 16, height  (1.651 m), weight 124.1 kg (273 lb 9.5 oz), last menstrual period 08/10/2017, unknown if currently breastfeeding. Exam Physical Exam  Constitutional: She is oriented to person, place, and time. She appears well-developed.  HENT:  Head: Normocephalic.  Neck: Normal range of motion.  Cardiovascular: Normal rate.  Respiratory: Effort normal.  GI:  EFM 130s, +accels, no decels, Cat 1 Ctx irreg, mild   Genitourinary:  Genitourinary Comments: Cx FT/thick/-3  Musculoskeletal: Normal range of motion.  Neurological: She is alert and oriented to person, place, and time.  Skin: Skin is warm and dry.  Psychiatric: She has a normal mood and affect. Her behavior is normal. Thought content normal.    CBG: 109  Prenatal labs: ABO, Rh: O/--/-- (11/05 0000) Antibody: Negative (11/05 0000) Rubella: Immune (11/05 0000) RPR: Nonreactive (03/28 0000)  HBsAg: Negative (11/05 0000)  HIV: Non-reactive (11/05 0000)  GBS: Negative (05/15 1134)   Assessment/Plan: IUP@37 .2wks Umb vein varix GDMA2 Cx unfavorable  Admit to [facility] cx ripening with cytotec to start; cervical foley/Pit/AROM prn depending on progress CBGs q4h Anticipate SVD- be prepared for LGA delivery   Paige Anthony CNM 05/08/2018, 8:45 PM

## 2018-05-08 NOTE — Progress Notes (Signed)
   PRENATAL VISIT NOTE  Subjective:  Paige Anthony is a 42 y.o. G5P4004 at [redacted]w[redacted]d being seen today for ongoing prenatal care.  She is currently monitored for the following issues for this high-risk pregnancy and has Advanced maternal age in multigravida; Supervision of high risk pregnancy, antepartum; and Gestational diabetes mellitus (GDM) affecting fifth pregnancy on their problem list.  Patient reports no complaints.  Contractions: Irregular. Vag. Bleeding: None.  Movement: Present. Denies leaking of fluid.   The following portions of the patient's history were reviewed and updated as appropriate: allergies, current medications, past family history, past medical history, past social history, past surgical history and problem list. Problem list updated.  Objective:   Vitals:   05/08/18 1047  BP: 126/78  Pulse: 87  Weight: 274 lb 3.2 oz (124.4 kg)    Fetal Status: Fetal Heart Rate (bpm): NST   Movement: Present  Presentation: Vertex  General:  Alert, oriented and cooperative. Patient is in no acute distress.  Skin: Skin is warm and dry. No rash noted.   Cardiovascular: Normal heart rate noted  Respiratory: Normal respiratory effort, no problems with respiration noted  Abdomen: Soft, gravid, appropriate for gestational age.  Pain/Pressure: Present     Pelvic: Cervical exam deferred        Extremities: Normal range of motion.  Edema: None  Mental Status: Normal mood and affect. Normal behavior. Normal judgment and thought content.   Assessment and Plan:  Pregnancy: G5P4004 at [redacted]w[redacted]d  1. Supervision of high risk pregnancy, antepartum   2. Gestational diabetes mellitus (GDM) affecting fifth pregnancy FBS 93-115 (1 in range) 2 hour pp 108-163 (10 of 15 out of range) Too late for insulin as is < 2 wks prior to delivery--will increase glyburide - glyBURIDE (DIABETA) 2.5 MG tablet; Take 2 pills at hs, 1 po q am  Dispense: 60 tablet; Refill: 3 U/S for growth is scheduled  today with BPP, NST is reactive  3. Multigravida of advanced maternal age in third trimester Low risk NIPT  4. Gastroesophageal reflux disease without esophagitis Change to Prilosec - omeprazole (PRILOSEC OTC) 20 MG tablet; Take 1 tablet (20 mg total) by mouth daily.  Dispense: 30 tablet; Refill: 3  Term labor symptoms and general obstetric precautions including but not limited to vaginal bleeding, contractions, leaking of fluid and fetal movement were reviewed in detail with the patient. Please refer to After Visit Summary for other counseling recommendations.  Return in about 1 week (around 05/15/2018) for as scheduled.  Future Appointments  Date Time Provider Department Center  05/14/2018  9:00 AM Lucie Leather, Alvira Philips, MD [facility] None  05/15/2018 10:15 AM [facility]  05/15/2018 11:15 AM Allie Bossier, MD [facility]  05/20/2018  7:30 AM [facility] None    Reva Bores, MD

## 2018-05-08 NOTE — Addendum Note (Signed)
Encounter addended by: Levonne Hubert, RDMS, RVT on: 05/08/2018 1:44 PM  Actions taken: Imaging Exam ended

## 2018-05-08 NOTE — Progress Notes (Signed)
[place] for growth and BPP today @ 1230.  IOL scheduled on 6/3 @ 0730

## 2018-05-08 NOTE — Progress Notes (Signed)
Labor Progress Note Paige Anthony is a 42 y.o. G5P4004 at [redacted]w[redacted]d presented for IOL due to nonturbulent umbilical vein varix in addition to AMA and GDMA2 (metformin & glyburide).  S:  Patient laying comfortably in bed and states she is nervous.  She is able to feel mild contractions and fetal movement.  Currently not in pain.  She is considering an epidural for pain control.     O:  BP (!) 144/73 (BP Location: Left Arm)   Pulse 75   Temp 98.2 F (36.8 C) (Oral)   Resp 16   Ht  (1.651 m)   Wt 273 lb 9.5 oz (124.1 kg)   LMP 08/10/2017   BMI 45.53 kg/m  EFM: 130bpm, moderate variability, reactive accelerations, absent decelerations  CVE: Dilation: Fingertip Effacement (%): Thick Station: -3 Presentation: Vertex Exam by:: Philipp Deputy, CNM   A&P: 42 y.o. 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  #Labor: Continue cx ripening with cytotec.  Cervical foley/pit/arom prn depending on progress.   #Pain: Analgesics as needed. #FWB: Cat I #GBS: negative  #GDM A2: POC BG checks q4h  Alfonso Ellis, Medical Student 11:51 PM

## 2018-05-08 NOTE — Patient Instructions (Signed)

## 2018-05-09 ENCOUNTER — Inpatient Hospital Stay (HOSPITAL_COMMUNITY): Payer: Medicaid Other | Admitting: Anesthesiology

## 2018-05-09 ENCOUNTER — Encounter (HOSPITAL_COMMUNITY): Payer: Self-pay | Admitting: *Deleted

## 2018-05-09 DIAGNOSIS — O24415 Gestational diabetes mellitus in pregnancy, controlled by oral hypoglycemic drugs: Secondary | ICD-10-CM

## 2018-05-09 DIAGNOSIS — Z3A37 37 weeks gestation of pregnancy: Secondary | ICD-10-CM

## 2018-05-09 LAB — GLUCOSE, CAPILLARY
GLUCOSE-CAPILLARY: 62 mg/dL — AB (ref 65–99)
GLUCOSE-CAPILLARY: 61 mg/dL — AB (ref 65–99)
Glucose-Capillary: 42 mg/dL — CL (ref 65–99)
Glucose-Capillary: 59 mg/dL — ABNORMAL LOW (ref 65–99)
Glucose-Capillary: 65 mg/dL (ref 65–99)
Glucose-Capillary: 68 mg/dL (ref 65–99)
Glucose-Capillary: 89 mg/dL (ref 65–99)

## 2018-05-09 LAB — RPR: RPR Ser Ql: NONREACTIVE

## 2018-05-09 MED ORDER — OXYTOCIN 40 UNITS IN LACTATED RINGERS INFUSION - SIMPLE MED
1.0000 m[IU]/min | INTRAVENOUS | Status: DC
  Administered 2018-05-09: 2 m[IU]/min via INTRAVENOUS
  Filled 2018-05-09: qty 1000

## 2018-05-09 MED ORDER — METHYLERGONOVINE MALEATE 0.2 MG/ML IJ SOLN: 0.2000 mg | INTRAMUSCULAR | Status: DC | PRN

## 2018-05-09 MED ORDER — PHENYLEPHRINE 40 MCG/ML (10ML) SYRINGE FOR IV PUSH (FOR BLOOD PRESSURE SUPPORT)
80.0000 ug | PREFILLED_SYRINGE | INTRAVENOUS | Status: DC | PRN
  Filled 2018-05-09: qty 10
  Filled 2018-05-09: qty 5

## 2018-05-09 MED ORDER — BENZOCAINE-MENTHOL 20-0.5 % EX AERO: 1.0000 "application " | INHALATION_SPRAY | CUTANEOUS | Status: DC | PRN

## 2018-05-09 MED ORDER — IBUPROFEN 600 MG PO TABS
600.0000 mg | ORAL_TABLET | Freq: Four times a day (QID) | ORAL | Status: DC
  Administered 2018-05-09 – 2018-05-11 (×7): 600 mg via ORAL
  Filled 2018-05-09 (×7): qty 1

## 2018-05-09 MED ORDER — ZOLPIDEM TARTRATE 5 MG PO TABS: 5.0000 mg | ORAL_TABLET | Freq: Every evening | ORAL | Status: DC | PRN

## 2018-05-09 MED ORDER — FENTANYL 2.5 MCG/ML BUPIVACAINE 1/10 % EPIDURAL INFUSION (WH - ANES)
14.0000 mL/h | INTRAMUSCULAR | Status: DC | PRN
  Administered 2018-05-09: 14 mL/h via EPIDURAL
  Filled 2018-05-09: qty 100

## 2018-05-09 MED ORDER — DOCUSATE SODIUM 100 MG PO CAPS
100.0000 mg | ORAL_CAPSULE | Freq: Two times a day (BID) | ORAL | Status: DC
  Administered 2018-05-09 – 2018-05-11 (×4): 100 mg via ORAL
  Filled 2018-05-09 (×4): qty 1

## 2018-05-09 MED ORDER — PHENYLEPHRINE 40 MCG/ML (10ML) SYRINGE FOR IV PUSH (FOR BLOOD PRESSURE SUPPORT)
80.0000 ug | PREFILLED_SYRINGE | INTRAVENOUS | Status: DC | PRN
  Filled 2018-05-09: qty 5

## 2018-05-09 MED ORDER — LIDOCAINE HCL (PF) 1 % IJ SOLN
INTRAMUSCULAR | Status: DC | PRN
  Administered 2018-05-09: 8 mL via EPIDURAL

## 2018-05-09 MED ORDER — WITCH HAZEL-GLYCERIN EX PADS: 1.0000 "application " | MEDICATED_PAD | CUTANEOUS | Status: DC | PRN

## 2018-05-09 MED ORDER — TERBUTALINE SULFATE 1 MG/ML IJ SOLN
0.2500 mg | Freq: Once | INTRAMUSCULAR | Status: DC | PRN
  Filled 2018-05-09: qty 1

## 2018-05-09 MED ORDER — MEASLES, MUMPS & RUBELLA VAC ~~LOC~~ INJ: 0.5000 mL | INJECTION | Freq: Once | SUBCUTANEOUS | Status: DC

## 2018-05-09 MED ORDER — COCONUT OIL OIL: 1.0000 "application " | TOPICAL_OIL | Status: DC | PRN

## 2018-05-09 MED ORDER — PRENATAL MULTIVITAMIN CH
1.0000 | ORAL_TABLET | Freq: Every day | ORAL | Status: DC
  Administered 2018-05-10 – 2018-05-11 (×2): 1 via ORAL
  Filled 2018-05-09 (×2): qty 1

## 2018-05-09 MED ORDER — SIMETHICONE 80 MG PO CHEW: 80.0000 mg | CHEWABLE_TABLET | ORAL | Status: DC | PRN

## 2018-05-09 MED ORDER — FERROUS SULFATE 325 (65 FE) MG PO TABS
325.0000 mg | ORAL_TABLET | Freq: Two times a day (BID) | ORAL | Status: DC
  Administered 2018-05-10 – 2018-05-11 (×3): 325 mg via ORAL
  Filled 2018-05-09 (×3): qty 1

## 2018-05-09 MED ORDER — ACETAMINOPHEN 325 MG PO TABS
650.0000 mg | ORAL_TABLET | ORAL | Status: DC | PRN
  Administered 2018-05-10: 650 mg via ORAL
  Filled 2018-05-09: qty 2

## 2018-05-09 MED ORDER — DIBUCAINE 1 % RE OINT: 1.0000 "application " | TOPICAL_OINTMENT | RECTAL | Status: DC | PRN

## 2018-05-09 MED ORDER — EPHEDRINE 5 MG/ML INJ
10.0000 mg | INTRAVENOUS | Status: DC | PRN
  Filled 2018-05-09: qty 2

## 2018-05-09 MED ORDER — DIPHENHYDRAMINE HCL 25 MG PO CAPS: 25.0000 mg | ORAL_CAPSULE | Freq: Four times a day (QID) | ORAL | Status: DC | PRN

## 2018-05-09 MED ORDER — METHYLERGONOVINE MALEATE 0.2 MG PO TABS: 0.2000 mg | ORAL_TABLET | ORAL | Status: DC | PRN

## 2018-05-09 MED ORDER — BISACODYL 10 MG RE SUPP: 10.0000 mg | Freq: Every day | RECTAL | Status: DC | PRN

## 2018-05-09 MED ORDER — ONDANSETRON HCL 4 MG PO TABS: 4.0000 mg | ORAL_TABLET | ORAL | Status: DC | PRN

## 2018-05-09 MED ORDER — DIPHENHYDRAMINE HCL 50 MG/ML IJ SOLN: 12.5000 mg | INTRAMUSCULAR | Status: DC | PRN

## 2018-05-09 MED ORDER — FLEET ENEMA 7-19 GM/118ML RE ENEM: 1.0000 | ENEMA | Freq: Every day | RECTAL | Status: DC | PRN

## 2018-05-09 MED ORDER — LACTATED RINGERS IV SOLN
500.0000 mL | Freq: Once | INTRAVENOUS | Status: AC
  Administered 2018-05-09: 500 mL via INTRAVENOUS

## 2018-05-09 MED ORDER — ONDANSETRON HCL 4 MG/2ML IJ SOLN: 4.0000 mg | INTRAMUSCULAR | Status: DC | PRN

## 2018-05-09 MED ORDER — TETANUS-DIPHTH-ACELL PERTUSSIS 5-2.5-18.5 LF-MCG/0.5 IM SUSP: 0.5000 mL | Freq: Once | INTRAMUSCULAR | Status: DC

## 2018-05-09 NOTE — Progress Notes (Signed)
Notified Paige Anthony of low blood glucose level of 42 and 65. Also informed her that the patient stated that her times of administration for her medications were different than her schedule at home.  No new orders received. Will continue to monitor.

## 2018-05-09 NOTE — Progress Notes (Signed)
Patient doing well, requests epidural at this time, breathing through contractions   Vitals:   05/09/18 1847 05/09/18 1851 05/09/18 1854 05/09/18 1856  BP: (!) 145/72 137/72 129/89 129/89  Pulse: 73 71 74 74  Resp:      Temp:      TempSrc:      SpO2:      Weight:      Height:       FHR: 140/ moderate/ +accels/ variable and early decels  Toco: 2-3 minutes   Dilation: 7 Effacement (%): 90 Cervical Position: Posterior Station: -1 Presentation: Vertex Exam by:: Mary [place] Johnson, RN   Labor: Expectant management until delivery  Tracing: Cat I Plan: SVD    Sharyon Cable, CNM 05/09/18, 7:01 PM

## 2018-05-09 NOTE — Anesthesia Pain Management Evaluation Note (Signed)
  [facility] Pain Management Visit Note  Patient: Paige Anthony, 42 y.o., female  "Hello I am a member of the anesthesia team at [facility]. We have an anesthesia team available at all times to provide care throughout the hospital, including epidural management and anesthesia for C-section. I don't know your plan for the delivery whether it a natural birth, water birth, IV sedation, nitrous supplementation, doula or epidural, but we want to meet your pain goals."   1.Was your pain managed to your expectations on prior hospitalizations?   Yes   2.What is your expectation for pain management during this hospitalization?     Epidural  3.How can we help you reach that goal? epidural  Record the patient's initial score and the patient's pain goal.   Pain: 4  Pain Goal: 7 [facility] wants you to be able to say your pain was always managed very well.  [name] 05/09/2018

## 2018-05-09 NOTE — Anesthesia Preprocedure Evaluation (Signed)
Anesthesia Evaluation  Patient identified by MRN, date of birth, ID band Patient awake    Reviewed: Allergy & Precautions, H&P , NPO status , Patient's Chart, lab work & pertinent test results, reviewed documented beta blocker date and time   Airway Mallampati: III  TM Distance: >3 FB Neck ROM: full    Dental no notable dental hx.    Pulmonary neg pulmonary ROS,    Pulmonary exam normal breath sounds clear to auscultation       Cardiovascular hypertension, On Medications negative cardio ROS Normal cardiovascular exam Rhythm:regular Rate:Normal     Neuro/Psych negative neurological ROS  negative psych ROS   GI/Hepatic negative GI ROS, Neg liver ROS,   Endo/Other  diabetes, Gestational  Renal/GU negative Renal ROS  negative genitourinary   Musculoskeletal   Abdominal   Peds  Hematology negative hematology ROS (+)   Anesthesia Other Findings   Reproductive/Obstetrics (+) Pregnancy                             Anesthesia Physical Anesthesia Plan  ASA: III  Anesthesia Plan: Epidural   Post-op Pain Management:    Induction:   PONV Risk Score and Plan:   Airway Management Planned:   Additional Equipment:   Intra-op Plan:   Post-operative Plan:   Informed Consent: I have reviewed the patients History and Physical, chart, labs and discussed the procedure including the risks, benefits and alternatives for the proposed anesthesia with the patient or authorized representative who has indicated his/her understanding and acceptance.     Plan Discussed with:   Anesthesia Plan Comments:         Anesthesia Quick Evaluation

## 2018-05-09 NOTE — Progress Notes (Signed)
Patient ID: Paige Anthony, female   DOB: 01-13-1976, 42 y.o.   MRN: [id number]  Pt having some mild cramping; s/p cytotec buccal x 3 doses; had some lower CBGs in the night- asymptomatic and improved with juice  BP 130/65, other VSS FHR 130s, +accels, Cat 1 Irreg ctx Cx 1/thick/vtx -3  IUP@term  GDMA2 UVV Cx unfavorable  Cook cervical foley inserted without difficulty; plan to start Pit once it comes out; will hold DM meds this morning  Cam Hai CNM 05/09/2018

## 2018-05-09 NOTE — Anesthesia Procedure Notes (Signed)
Epidural Patient location during procedure: OB Start time: 05/09/2018 6:25 PM End time: 05/09/2018 6:30 PM  Staffing Anesthesiologist: Bethena Midget, MD  Preanesthetic Checklist Completed: patient identified, site marked, surgical consent, pre-op evaluation, timeout performed, IV checked, risks and benefits discussed and monitors and equipment checked  Epidural Patient position: sitting Prep: site prepped and draped and DuraPrep Patient monitoring: continuous pulse ox and blood pressure Approach: midline Location: L4-L5 Injection technique: LOR air  Needle:  Needle type: Tuohy  Needle gauge: 17 G Needle length: 9 cm and 9 Needle insertion depth: 8 cm Catheter type: closed end flexible Catheter size: 19 Gauge Catheter at skin depth: 13 cm Test dose: negative  Assessment Events: blood not aspirated, injection not painful, no injection resistance, negative IV test and no paresthesia

## 2018-05-09 NOTE — Progress Notes (Signed)
Vitals:   05/09/18 1337 05/09/18 1421 05/09/18 1447 05/09/18 1449  BP: 138/70 130/72 132/69   Pulse: 72 75 78   Resp: 18   17  Temp:    98.1 F (36.7 C)  TempSrc:    Oral  Weight:      Height:       Patient doing well, breathing through some contractions.   FHR: 125/ moderate/ +accels with no decels  Toco: 2-3/ mild-moderate by palpation   AROM - clear fluid, baby well applied to cervix after AROM  Continue pitocin titration- currently on 15milli-units   Dilation: 5.5 Effacement (%): Thick Cervical Position: Posterior Station: -2 Presentation: Vertex Exam by:: Izaiha Lo  Tracing: Cat I Plan: SVD   Sharyon Cable, CNM 05/09/18, 2:55 PM

## 2018-05-09 NOTE — Progress Notes (Signed)
Labor Progress Note Paige Anthony is a 42 y.o. G5P4004 at [redacted]w[redacted]d presented for IOL due to nonturbulent umbilical vein varix in addition to AMA and GDMA2 (metformin & glyburide)  S: Patient able to feel contractions more frequently now at a level of 3 from 0-10 and fetal movement.  Patient had 2 low BG, 42 and 65.  Patient shares she usually does not take metformin and glyburide at the same time of day, which may be reason why.  Patient denies symptoms of HA, dizziness, N/V, weakness.  Patient is able to ambulate to the bathroom.  O:  BP 125/63   Pulse 75   Temp 98.2 F (36.8 C) (Oral)   Resp 16   Ht  (1.651 m)   Wt 273 lb 9.5 oz (124.1 kg)   LMP 08/10/2017   BMI 45.53 kg/m  EFM: 130bpm, moderate variability, reactive accelerations, absent decelerations  CVE: Dilation: Fingertip Effacement (%): 30 Cervical Position: Posterior Station: -3 Presentation: Vertex Exam by:: Mary [place] Johnson, RN    A&P: 42 y.o. [phone] [redacted]w[redacted]d presented for IOL due to nonturbulent umbilical vein varix in addition to AMA and GDMA2 (metformin & glyburide) #Labor: Continue cx ripening with cytotec.  Cervical foley/pit/arom prn depending on progress. #Pain: Analgesics as needed. #FWB: Cat I #GBS negative #GDM A2: POC glucose cHouda [name], Medical Student 5:02 AM

## 2018-05-09 NOTE — Anesthesia Postprocedure Evaluation (Signed)
Anesthesia Post Note  Patient: Paige Anthony  Procedure(s) Performed: AN AD HOC LABOR EPIDURAL     Patient location during evaluation: Mother Baby Anesthesia Type: Epidural Level of consciousness: awake and alert Pain management: pain level controlled Vital Signs Assessment: post-procedure vital signs reviewed and stable Respiratory status: spontaneous breathing, nonlabored ventilation and respiratory function stable Cardiovascular status: stable Postop Assessment: no headache, no backache and epidural receding Anesthetic complications: no    Last Vitals:  Vitals:   05/09/18 2031 05/09/18 2057  BP: 134/70 (!) 143/72  Pulse: 72 72  Resp:  18  Temp:  36.7 C  SpO2:      Last Pain:  Vitals:   05/09/18 2057  TempSrc: Axillary  PainSc: 0-No pain   Pain Goal:                 Dontavis Tschantz

## 2018-05-09 NOTE — Progress Notes (Signed)
LABOR PROGRESS NOTE  Paige Anthony is a 42 y.o. [id number] at [redacted]w[redacted]d  admitted for IOL for umbilical vein varix. Patient also has GDM.   Subjective: Patient doing well, patient is up walking around room and in halls, reports small amount of pain.   Objective: BP 131/87   Pulse 85   Temp 98.2 F (36.8 C) (Oral)   Resp 16   Ht  (1.651 m)   Wt 273 lb 9.5 oz (124.1 kg)   LMP 08/10/2017   BMI 45.53 kg/m  or  Vitals:   05/09/18 0947 05/09/18 1030 05/09/18 1117 05/09/18 1216  BP: 121/73 (!) 134/92 130/76 131/87  Pulse: 76 68 69 85  Resp:      Temp:      TempSrc:      Weight:      Height:        Pitocin currently on 6 milli-unit/min Dilation: 5 Effacement (%): Thick Cervical Position: Posterior Station: -2 Presentation: Vertex Exam by:: lee FHT: baseline rate 135, moderate varibility, +acel, no decel Toco: 2-6 minutes/ mild by palpation   Labs: Lab Results  Component Value Date   WBC 11.1 (H) 05/08/2018   HGB 12.4 05/08/2018   HCT 37.8 05/08/2018   MCV 87.7 05/08/2018   PLT 248 05/08/2018    Patient Active Problem List   Diagnosis Date Noted  . Umbilical vein abnormality affecting pregnancy 05/08/2018  . Gestational diabetes mellitus (GDM) affecting fifth pregnancy 03/25/2018  . Supervision of high risk pregnancy, antepartum 03/19/2018  . Advanced maternal age in multigravida 11/16/2017    Assessment / Plan: 42 y.o. G5P4004 at [redacted]w[redacted]d here for IOL for umbilical vein varix   Labor: Continue pitocin titration, possible AROM in 2 hours at cervical exam  Fetal Wellbeing:  Cat I Pain Control:  Plans epidural, medication ordered PRN  Anticipated MOD:  SVD  Sharyon Cable, CNM 05/09/2018, 12:44 PM

## 2018-05-10 LAB — GLUCOSE, CAPILLARY
GLUCOSE-CAPILLARY: 118 mg/dL — AB (ref 65–99)
Glucose-Capillary: 65 mg/dL (ref 65–99)

## 2018-05-10 NOTE — Anesthesia Postprocedure Evaluation (Signed)
Anesthesia Post Note  Patient: Paige Anthony  Procedure(s) Performed: AN AD HOC LABOR EPIDURAL     Patient location during evaluation: Mother Baby Anesthesia Type: Epidural Level of consciousness: awake Pain management: pain level controlled Vital Signs Assessment: post-procedure vital signs reviewed and stable Respiratory status: spontaneous breathing Cardiovascular status: stable Postop Assessment: epidural receding and patient able to bend at knees Anesthetic complications: no    Last Vitals:  Vitals:   05/10/18 0613 05/10/18 0800  BP: (!) 116/57 103/60  Pulse: 65 66  Resp: 18 18  Temp: 36.4 C 36.9 C  SpO2:  98%    Last Pain:  Vitals:   05/10/18 0800  TempSrc: Oral  PainSc: 0-No pain   Pain Goal: Patients Stated Pain Goal: 3 (05/10/18 0800)               Edison Pace

## 2018-05-10 NOTE — Addendum Note (Signed)
Addendum  created 05/10/18 0839 by Earmon Phoenix, CRNA   Charge Capture section accepted, Sign clinical note

## 2018-05-10 NOTE — Progress Notes (Addendum)
Blood sugar 65 and asymptomatic, given apple juice and will reassess in 

## 2018-05-10 NOTE — Lactation Note (Signed)
This note was copied from a baby's chart. Lactation Consultation Note  Patient Name: Paige Anthony Today's Date: 05/10/2018 Reason for consult: Initial assessment;Early term 37-38.6wks  P5 mother whose infant is now 14 hours old. Mother has breastfed her other 4 children, ranging in length of time from 3 months-2 years.    Infant in bassinet showing feeding cues.  Offered to assist mother and she accepted.  Infant latched easily in the laid back position on the left breast.  Lips are flanged and rhythmic sucking noted.  Mother's breasts are soft and non tender and nipples are erect with no breakdown.  Encouraged to feed 8-12 times/24 hours or earlier if he shows feeding cues.  Mother aware of feeding cues.  Continue skin to skin with baby.  Mom made aware of O/P services, breastfeeding support groups, community resources, and our phone # for post-discharge questions. Mother will call for assistance as needed.  Her oldest son is at bedside and will be staying to assist as needed.   Maternal Data Formula Feeding for Exclusion: No Has patient been taught Hand Expression?: Yes Does the patient have breastfeeding experience prior to this delivery?: Yes  Feeding Feeding Type: Breast Fed Length of feed: 5 min(still feeding)  LATCH Score Latch: Grasps breast easily, tongue down, lips flanged, rhythmical sucking.  Audible Swallowing: None  Type of Nipple: Everted at rest and after stimulation  Comfort (Breast/Nipple): Soft / non-tender  Hold (Positioning): Assistance needed to correctly position infant at breast and maintain latch.  LATCH Score: 7  Interventions Interventions: Breast feeding basics reviewed;Assisted with latch;Skin to skin  Lactation Tools Discussed/Used WIC Program: Yes   Consult Status Consult Status: Follow-up Date: 05/11/18 Follow-up type: In-patient    Jaylissa Felty R Jaber Dunlow 05/10/2018, 1:26 AM

## 2018-05-10 NOTE — Progress Notes (Addendum)
POSTPARTUM PROGRESS NOTE  Post Partum Day 1  Subjective:  Paige Anthony is a 42 y.o. [id number] s/p NSVD at [redacted]w[redacted]d  She reports she is doing well. No acute events overnight. She denies any problems with ambulating, voiding or po intake. Denies nausea or vomiting.  Pain is well controlled.  Lochia is nml.  Patient denies HA, dizziness, vision changes.  Objective: Blood pressure 103/60, pulse 66, temperature 98.4 F (36.9 C), temperature source Oral, resp. rate 18, height 5' 5" (1.651 m), weight 273 lb 9.5 oz (124.1 kg), last menstrual period 08/10/2017, SpO2 98 %, unknown if currently breastfeeding.  Physical Exam:  General: alert, cooperative and no distress Chest: no respiratory distress Heart:regular rate, distal pulses intact Abdomen: soft, nontender,  Uterine Fundus: firm, appropriately tender DVT Evaluation: No calf swelling or tenderness Extremities: no edema Skin: warm, dry  Recent Labs    05/08/18 2015  HGB 12.4  HCT 37.8    Assessment/Plan: Keosha B EAdalind Weitzis a 42y.o. [id number]/p NSVD at 334w3d PPD#1 - Doing well  Routine postpartum care  Fasting BG stable 65 Contraception: IUD Feeding: Breastfeeding Dispo: Plan for discharge 05/11/2018.   LOS: 2 days   NgMalta Bendtudent 05/10/2018, 8:51 AM   I have spoken with and examined this patient and agree with resident/PA-S/Med-S/SNM's note and plan of care. VSS, HRR&R, Resp unlabored, Legs neg.  FrNigel BertholdCNM 05/15/2018 4:56 PM

## 2018-05-11 MED ORDER — IBUPROFEN 600 MG PO TABS: 600.0000 mg | ORAL_TABLET | Freq: Four times a day (QID) | ORAL | 0 refills | Status: AC | PRN

## 2018-05-11 NOTE — Lactation Note (Signed)
This note was copied from a baby's chart. Lactation Consultation Note  Patient Name: Boy Zeriyah Aleena Kirkeby Today's Date: 05/11/2018 Reason for consult: Follow-up assessment;Early term 37-38.6wks   P5, Ex BF.  She breastfed last child for 2 years. 7.9% weight loss w/ 8 voids and 2 stools in the last 24 hours. Mother hand expressed some drops before latching. Baby latched in cradle with intermittent sucks and swallows. Mother has manual pump.  Discussed if baby becomes sleepy at the breast or will not latch, mother will pump/hand express and give baby volume. Mom encouraged to feed baby 8-12 times/24 hours and with feeding cues.  Encouraged mother to do monitoring voids/stools.    Maternal Data    Feeding Feeding Type: Breast Fed Length of feed: 25 min  LATCH Score Latch: Grasps breast easily, tongue down, lips flanged, rhythmical sucking.  Audible Swallowing: A few with stimulation  Type of Nipple: Everted at rest and after stimulation  Comfort (Breast/Nipple): Soft / non-tender  Hold (Positioning): No assistance needed to correctly position infant at breast.  LATCH Score: 9  Interventions Interventions: Breast feeding basics reviewed  Lactation Tools Discussed/Used     Consult Status Consult Status: Complete    Hardie Pulley 05/11/2018, 9:37 AM

## 2018-05-11 NOTE — Discharge Summary (Addendum)
OB Discharge Summary     Patient Name: Paige Anthony DOB: Sep 06, 1976 MRN: [id number]  Date of admission: 05/08/2018 Delivering MD: Jacklyn Shell   Date of discharge: 05/11/2018  Admitting diagnosis: INDUCTION Intrauterine pregnancy: [redacted]w[redacted]d     Secondary diagnosis:  Principal Problem:   SVD (spontaneous vaginal delivery) Active Problems:   Advanced maternal age in multigravida   Supervision of high risk pregnancy, antepartum   Gestational diabetes mellitus (GDM) affecting fifth pregnancy   Umbilical vein abnormality affecting pregnancy  Additional problems: none     Discharge diagnosis: Term Pregnancy Delivered and GDM A2                                                                                                Post partum procedures:none  Augmentation: AROM, Pitocin, Cytotec and Foley Balloon  Complications: None  Hospital course:  Induction of Labor With Vaginal Delivery   42 y.o. yo G5P5005 at [redacted]w[redacted]d was admitted to the hospital 05/08/2018 for induction of labor.  Indication for induction: advised by MFM for a small non-turbulent umbilical vein Varix  (UVV)  in the area of the intraamniotic portion of the cord.  Patient had an uncomplicated labor course as follows: Membrane Rupture Time/Date: 2:21 PM ,05/09/2018   Intrapartum Procedures: Episiotomy: None [1]                                         Lacerations:  None [1]  Patient had delivery of a Viable infant.  Information for the patient's newborn:  Sarie, Stall Saran [[id number]]  Delivery Method: Vag-Spont   05/09/2018  Details of delivery can be found in separate delivery note.  Patient had a routine postpartum course. Patient is discharged home 05/11/18.  Physical exam  Vitals:   05/10/18 0053 05/10/18 0613 05/10/18 0800 05/10/18 1746  BP: 132/85 (!) 116/57 103/60 134/80  Pulse: 85 65 66 73  Resp: Temp: 98.2 F (36.8 C) 97.6 F (36.4 C) 98.4 F (36.9 C) 97.7  F (36.5 C)  TempSrc: Oral Oral Oral Oral  SpO2:   98%   Weight:      Height:       General: alert, cooperative and no distress Lochia: appropriate Uterine Fundus: firm Incision: N/A DVT Evaluation: No evidence of DVT seen on physical exam. Labs: Lab Results  Component Value Date   WBC 11.1 (H) 05/08/2018   HGB 12.4 05/08/2018   HCT 37.8 05/08/2018   MCV 87.7 05/08/2018   PLT 248 05/08/2018   CMP Latest Ref Rng & Units 05/29/2009  Glucose 70 - 99 mg/dL 782(N)  BUN 6 - 23 mg/dL 14  Creatinine 0.4 - 1.2 mg/dL 0.7  Sodium 562 - 130 mEq/L 140  Potassium 3.5 - 5.1 mEq/L 3.7  Chloride 96 - 112 mEq/L 105    Discharge instruction: per After Visit Summary and "Baby and Me Booklet".  After visit meds:  Allergies as of 05/11/2018      Reactions  Latex Rash      Medication List    STOP taking these medications   glyBURIDE 2.5 MG tablet Commonly known as:  DIABETA   metFORMIN 500 MG tablet Commonly known as:  GLUCOPHAGE     TAKE these medications   albuterol 90 MCG/ACT inhaler Commonly known as:  PROVENTIL,VENTOLIN Inhale 2 puffs into the lungs every 6 (six) hours as needed. Shortness of breath   cetirizine 10 MG tablet Commonly known as:  ZYRTEC Take 10 mg by mouth daily.   ibuprofen 600 MG tablet Commonly known as:  ADVIL,MOTRIN Take 1 tablet (600 mg total) by mouth every 6 (six) hours as needed.   omeprazole 20 MG tablet Commonly known as:  PRILOSEC OTC Take 1 tablet (20 mg total) by mouth daily.   prenatal multivitamin Tabs tablet Take 1 tablet by mouth daily at 12 noon.   ranitidine 150 MG tablet Commonly known as:  ZANTAC Take 150 mg by mouth 2 (two) times daily.       Diet: routine diet  Activity: Advance as tolerated. Pelvic rest for 6 weeks.   Outpatient follow up:6 weeks Follow up Appt:No future appointments. Follow up Visit: Follow-up Information    Center for [facility]. Go in 6 week(s).   Specialty:  Obstetrics and  Gynecology Contact information: [address] [place] [address] [phone]         Postpartum contraception: IUD  Newborn Data: Live born female  Birth Weight: 8 lb 1.6 oz (3675 g) APGAR: 8, 9  Newborn Delivery   Birth date/time:  05/09/2018 19:33:00 Delivery type:  Vaginal, Spontaneous     Baby Feeding: Breast Disposition:home with mother   05/11/2018 Felisa Bonier, MD, PGY-1 Family Medicine - [facility] [place]   OB FELLOW DISCHARGE ATTESTATION  I have seen and examined this patient. I agree with above documentation and have made edits as needed.   Caryl Ada, DO OB Fellow

## 2018-05-14 ENCOUNTER — Ambulatory Visit: Payer: Self-pay | Admitting: Allergy and Immunology

## 2018-05-14 ENCOUNTER — Encounter: Payer: Self-pay | Admitting: *Deleted

## 2018-05-15 ENCOUNTER — Encounter: Payer: Self-pay | Admitting: Obstetrics & Gynecology

## 2018-05-15 ENCOUNTER — Other Ambulatory Visit: Payer: Self-pay

## 2018-05-20 ENCOUNTER — Inpatient Hospital Stay (HOSPITAL_COMMUNITY): Admission: RE | Admit: 2018-05-20 | Payer: Self-pay | Source: Ambulatory Visit

## 2018-05-22 ENCOUNTER — Other Ambulatory Visit: Payer: Self-pay

## 2018-05-22 ENCOUNTER — Encounter: Payer: Self-pay | Admitting: Obstetrics & Gynecology

## 2018-05-29 ENCOUNTER — Encounter: Payer: Self-pay | Admitting: Obstetrics and Gynecology

## 2018-05-29 ENCOUNTER — Other Ambulatory Visit: Payer: Self-pay

## 2018-06-10 ENCOUNTER — Encounter: Payer: Self-pay | Admitting: Advanced Practice Midwife

## 2018-06-10 ENCOUNTER — Ambulatory Visit (INDEPENDENT_AMBULATORY_CARE_PROVIDER_SITE_OTHER): Payer: Medicaid Other | Admitting: Advanced Practice Midwife

## 2018-06-10 ENCOUNTER — Other Ambulatory Visit (HOSPITAL_COMMUNITY)
Admission: RE | Admit: 2018-06-10 | Discharge: 2018-06-10 | Disposition: A | Discharge status: Home / Self Care | Payer: Medicaid Other | Source: Ambulatory Visit | Attending: Advanced Practice Midwife | Admitting: Advanced Practice Midwife

## 2018-06-10 VITALS — Wt 251.1 lb

## 2018-06-10 DIAGNOSIS — Z3009 Encounter for other general counseling and advice on contraception: Secondary | ICD-10-CM | POA: Diagnosis not present

## 2018-06-10 DIAGNOSIS — Z30430 Encounter for insertion of intrauterine contraceptive device: Secondary | ICD-10-CM

## 2018-06-10 DIAGNOSIS — Z1389 Encounter for screening for other disorder: Secondary | ICD-10-CM

## 2018-06-10 DIAGNOSIS — Z01419 Encounter for gynecological examination (general) (routine) without abnormal findings: Secondary | ICD-10-CM | POA: Insufficient documentation

## 2018-06-10 LAB — POCT PREGNANCY, URINE: PREG TEST UR: NEGATIVE

## 2018-06-10 MED ORDER — LEVONORGESTREL 19.5 MCG/DAY IU IUD
INTRAUTERINE_SYSTEM | Freq: Once | INTRAUTERINE | Status: AC
  Administered 2018-06-10: 1 via INTRAUTERINE

## 2018-06-10 NOTE — Addendum Note (Signed)
Addended by: Faythe CasaBELLAMY, Azilee Pirro M on: 06/10/2018 07:05 PM   Modules accepted: Orders

## 2018-06-10 NOTE — Progress Notes (Signed)
Subjective:     Paige Anthony is a 42 y.o. female who presents for a postpartum visit. She is 4 weeks postpartum following a spontaneous vaginal delivery. I have fully reviewed the prenatal and intrapartum course. The delivery was at 37 gestational weeks. Outcome: spontaneous vaginal delivery. Anesthesia: epidural. Postpartum course has been uncomplicated. Baby's course has been uncomplicated. Baby is feeding by breast. Bleeding no bleeding. Bowel function is normal. Bladder function is normal. Patient is not sexually active. Contraception method is oral progesterone-only contraceptive. Postpartum depression screening: negative.  The following portions of the patient's history were reviewed and updated as appropriate: allergies, current medications, past family history, past medical history, past social history, past surgical history and problem list.  Review of Systems Pertinent items are noted in HPI.   Objective:    LMP 08/10/2017   General:  alert, cooperative and appears stated age   Breasts:  inspection negative, no nipple discharge or bleeding, no masses or nodularity palpable  Lungs: clear to auscultation bilaterally  Heart:  regular rate and rhythm, S1, S2 normal, no murmur, click, rub or gallop  Abdomen: soft, non-tender; bowel sounds normal; no masses,  no organomegaly   Vulva:  normal  Vagina: normal vagina  Cervix:  multiparous appearance  Corpus: normal  Adnexa:  normal adnexa  Rectal Exam: Not performed.         GYNECOLOGY OFFICE PROCEDURE NOTE  Paige Anthony is a 42 y.o. [id number] here for Liletta IUD insertion. No GYN concerns.  Pap collected today   IUD Insertion Procedure Note Patient identified, informed consent performed, consent signed.   Discussed risks of irregular bleeding, cramping, infection, malpositioning or misplacement of the IUD outside the uterus which may require further procedure such as laparoscopy. Time out was performed.   Urine pregnancy test negative.  Speculum placed in the vagina.  Cervix visualized.  Cleaned with Betadine x 2.  Grasped anteriorly with a single tooth tenaculum.  Uterus sounded to 8 cm.  Liletta IUD placed per manufacturer's recommendations.  Strings trimmed to 3 cm. Tenaculum was removed, good hemostasis noted.  Patient tolerated procedure well.   Patient was given post-procedure instructions.  She was advised to have backup contraception for one week.  Patient was also asked to check IUD strings periodically and follow up in 4 weeks for IUD check.   Assessment:     Routine postpartum exam. Pap smear done at today's visit.   Plan:    1. Contraception: IUD 2. Needs 2 hour GTT 2/2 hx of GDM  3. Follow up in: 1 month or as needed.

## 2018-06-10 NOTE — Patient Instructions (Signed)

## 2018-06-12 LAB — CYTOLOGY - PAP
Diagnosis: NEGATIVE
HPV (WINDOPATH): NOT DETECTED

## 2018-06-13 ENCOUNTER — Other Ambulatory Visit: Payer: Self-pay | Admitting: *Deleted

## 2018-06-13 NOTE — Progress Notes (Signed)
Opened in error

## 2018-06-17 ENCOUNTER — Other Ambulatory Visit: Payer: Medicaid Other

## 2018-06-18 LAB — GLUCOSE TOLERANCE, 2 HOURS
GLUCOSE FASTING GTT: 84 mg/dL (ref 65–99)
GLUCOSE, 2 HOUR: 54 mg/dL — AB (ref 65–139)

## 2018-07-08 ENCOUNTER — Ambulatory Visit: Payer: Medicaid Other | Admitting: Advanced Practice Midwife

## 2019-07-03 ENCOUNTER — Other Ambulatory Visit: Payer: Self-pay | Admitting: Internal Medicine

## 2019-07-03 DIAGNOSIS — Z20822 Contact with and (suspected) exposure to covid-19: Secondary | ICD-10-CM

## 2019-07-08 LAB — NOVEL CORONAVIRUS, NAA: SARS-CoV-2, NAA: DETECTED — AB

## 2019-08-05 ENCOUNTER — Encounter: Payer: Self-pay | Admitting: Allergy and Immunology

## 2019-08-05 ENCOUNTER — Other Ambulatory Visit: Payer: Self-pay

## 2019-08-05 ENCOUNTER — Ambulatory Visit: Payer: Self-pay | Admitting: Allergy and Immunology

## 2019-08-05 VITALS — BP 132/76 | HR 74 | Temp 98.3°F | Resp 18 | Ht 61.5 in | Wt 270.0 lb

## 2019-08-05 DIAGNOSIS — J4530 Mild persistent asthma, uncomplicated: Secondary | ICD-10-CM

## 2019-08-05 DIAGNOSIS — J3089 Other allergic rhinitis: Secondary | ICD-10-CM

## 2019-08-05 DIAGNOSIS — K219 Gastro-esophageal reflux disease without esophagitis: Secondary | ICD-10-CM

## 2019-08-05 MED ORDER — BUDESONIDE-FORMOTEROL FUMARATE 160-4.5 MCG/ACT IN AERO: 2.0000 | INHALATION_SPRAY | Freq: Every day | RESPIRATORY_TRACT | 5 refills | Status: DC

## 2019-08-05 NOTE — Patient Instructions (Addendum)
  1.  Allergen avoidance measures  2.  Treat and prevent inflammation:   A. Symbicort 160 -2 inhalations 1 time per day (samples)  B.  OTC Nasacort-1 spray each nostril 3-7 times a week  3.  Treat and prevent reflux:   A.  Consolidate caffeine and chocolate consumption  B.  Omeprazole 40 mg - 1 tablet 1 time per day  3.  If needed:   A.  Albuterol HFA- 2 inhalations every 4-6 hours  B.  OTC Zyrtec/cetirizine 10 mg - 1 tablet 1 time per day  C.  OTC nasal saline  4.  Return to clinic in 12 weeks or earlier if problem  5.  Obtain fall flu vaccine

## 2019-08-05 NOTE — Progress Notes (Signed)
[place]   Dear Brandon MelnickSandra Baker,  Thank you for referring Mamta B Sloan LeiterEncerrado [facility] [place] on 08/05/2019.   Below is a summation of this patient's evaluation and recommendations.  Thank you for your referral. I will keep you informed about this patient's response to treatment.   If you have any questions please do not hesitate to contact me.   Sincerely,  Jessica PriestEric J. Kozlow, MD Allergy / Immunology [facility] [place]   ______________________________________________________________________    NEW PATIENT NOTE  Referring Provider: Trina AoBaker, Sandra K, NP Primary Provider: Trina AoBaker, Sandra K, NP Date of office visit: 08/05/2019    Subjective:   Chief Complaint:  Paige Anthony (DOB: 12/24/1975) is a 43 y.o. female who presents to the clinic on 08/05/2019 with a chief complaint of Allergic Rhinitis  .     HPI: Sahirah presents to this clinic in evaluation of asthma and allergies.  I had seen her in this clinic many years ago for a similar issue.  Most recently she has contracted COVID-19 as did almost every member of the family in which she developed rather significant coughing and wheezing and fever and chills and aches.  Her positive tests developed on 03 July 2019.  Fortunately, almost all of those acute symptoms have abated.  Even prior to her COVID she had coughing and must use a short acting bronchodilator about twice a day.  She does not really exercise to any degree.  It does not sound as though she has required a systemic steroid in over a year to treat her asthma.  She does not have any controller agent to utilize in the treatment of her asthma.  She also has nasal congestion and sneezing for which he uses Zyrtec which helps somewhat.  She can smell and she does not have recurrent headaches.  She uses nasal saline on occasion.   She also has an issue with reflux for which he uses omeprazole which works very well.  Past Medical History:  Diagnosis Date  . Asthma   . Chlamydia   . Gestational diabetes   . Hypertension   . UTI (lower urinary tract infection)     Past Surgical History:  Procedure Laterality Date  . NO PAST SURGERIES      Allergies as of 08/05/2019      Reactions   Latex Rash      Medication List    albuterol 90 MCG/ACT inhaler Commonly known as: PROVENTIL,VENTOLIN Inhale 2 puffs into the lungs every 6 (six) hours as needed. Shortness of breath   cetirizine 10 MG tablet Commonly known as: ZYRTEC Take 10 mg by mouth daily.   ibuprofen 600 MG tablet Commonly known as: ADVIL Take 1 tablet (600 mg total) by mouth every 6 (six) hours as needed.   multivitamin tablet Take 1 tablet by mouth daily.   omeprazole 20 MG tablet Commonly known as: PriLOSEC OTC Take 1 tablet (20 mg total) by mouth daily.       Review of systems negative except as noted in HPI / PMHx or noted below:  Review of Systems  Constitutional: Negative.   HENT: Negative.   Eyes: Negative.   Respiratory: Negative.   Cardiovascular: Negative.   Gastrointestinal: Negative.   Genitourinary: Negative.   Musculoskeletal: Negative.   Skin: Negative.   Neurological: Negative.   Endo/Heme/Allergies: Negative.   Psychiatric/Behavioral:  Negative.     History reviewed. No pertinent family history.  Social History   Socioeconomic History  . Marital status: Married    Spouse name: Not on file  . Number of children: Not on file  . Years of education: Not on file  . Highest education level: Not on file  Occupational History  . Not on file  Social Needs  . Financial resource strain: Not on file  . Food insecurity    Worry: Not on file    Inability: Not on file  . Transportation needs    Medical: Not on file    Non-medical: Not on file  Tobacco Use  . Smoking status: Never Smoker  . Smokeless tobacco:  Never Used  Substance and Sexual Activity  . Alcohol use: No  . Drug use: No  . Sexual activity: Yes    Birth control/protection: None  Lifestyle  . Physical activity    Days per week: Not on file    Minutes per session: Not on file  . Stress: Not on file  Relationships  . Social Musicianconnections    Talks on phone: Not on file    Gets together: Not on file    Attends religious service: Not on file    Active member of club or organization: Not on file    Attends meetings of clubs or organizations: Not on file    Relationship status: Not on file  . Intimate partner violence    Fear of current or ex partner: Not on file    Emotionally abused: Not on file    Physically abused: Not on file    Forced sexual activity: Not on file  Other Topics Concern  . Not on file  Social History Narrative  . Not on file    Environmental and Social history  Lives in a house with a dry environment, a hamster located inside the household, no carpet in the bedroom, no plastic on the bed, no plastic on the pillow, no smoking ongoing with inside the household.  Objective:   Vitals:   08/05/19 0924  BP: 132/76  Pulse: 74  Resp: 18  Temp: 98.3 F (36.8 C)  SpO2: 97%   Height: 5' 1.5" (156.2 cm) Weight: 270 lb (122.5 kg)  Physical Exam Constitutional:      Appearance: She is not diaphoretic.  HENT:     Head: Normocephalic.     Right Ear: Tympanic membrane, ear canal and external ear normal.     Left Ear: Tympanic membrane, ear canal and external ear normal.     Nose: Nose normal. No mucosal edema or rhinorrhea.     Mouth/Throat:     Pharynx: Uvula midline. No oropharyngeal exudate.  Eyes:     Conjunctiva/sclera: Conjunctivae normal.  Neck:     Thyroid: No thyromegaly.     Trachea: Trachea normal. No tracheal tenderness or tracheal deviation.  Cardiovascular:     Rate and Rhythm: Normal rate and regular rhythm.     Heart sounds: Normal heart sounds, S1 normal and S2 normal. No murmur.   Pulmonary:     Effort: No respiratory distress.     Breath sounds: Normal breath sounds. No stridor. No wheezing or rales.  Lymphadenopathy:     Head:     Right side of head: No tonsillar adenopathy.     Left side of head: No tonsillar adenopathy.     Cervical: No cervical adenopathy.  Skin:    Findings: No erythema or rash.  Nails: There is no clubbing.   Neurological:     Mental Status: She is alert.     Diagnostics: Allergy skin tests were performed.  She demonstrated hypersensitivity to house dust mite.  Spirometry was performed and demonstrated an FEV1 of 1.93 @ 70 % of predicted. FEV1/FVC = 0.85  Assessment and Plan:    1. Not well controlled mild persistent asthma   2. Perennial allergic rhinitis   3. Gastroesophageal reflux disease, esophagitis presence not specified     1.  Allergen avoidance measures  2.  Treat and prevent inflammation:   A. Symbicort 160 -2 inhalations 1 time per day (samples)  B.  OTC Nasacort-1 spray each nostril 3-7 times a week  3.  Treat and prevent reflux:   A.  Consolidate caffeine and chocolate consumption  B.  Omeprazole 40 mg - 1 tablet 1 time per day  3.  If needed:   A.  Albuterol HFA- 2 inhalations every 4-6 hours  B.  OTC Zyrtec/cetirizine 10 mg - 1 tablet 1 time per day  C.  OTC nasal saline  4.  Return to clinic in 12 weeks or earlier if problem  5.  Obtain fall flu vaccine  Arminda certainly has inflammatory disease of her respiratory tract based upon her atopic immune system and we will get her to perform allergen avoidance measures and consistently use anti-inflammatory agents for her airway as noted above.  She also has reflux and we will have her consistently use omeprazole at 40 mg daily.  I will see her back in this clinic in 12 weeks or earlier if there is a problem.  Her care is complicated by the fact that she has no health insurance and thus we will probably need to provide her samples of her controller  agents for her asthma.  Jiles Prows, MD Allergy / Immunology [facility]

## 2019-08-06 ENCOUNTER — Encounter: Payer: Self-pay | Admitting: Allergy and Immunology

## 2019-09-09 ENCOUNTER — Other Ambulatory Visit: Payer: Self-pay

## 2019-09-09 ENCOUNTER — Ambulatory Visit (INDEPENDENT_AMBULATORY_CARE_PROVIDER_SITE_OTHER): Payer: Self-pay

## 2019-09-09 ENCOUNTER — Ambulatory Visit: Payer: Self-pay

## 2019-09-09 DIAGNOSIS — Z23 Encounter for immunization: Secondary | ICD-10-CM

## 2019-09-09 NOTE — Progress Notes (Signed)
Patient came in today to receive her annual flu vaccine. Patient was given fluarix in the left deltoid. Patient tolerated well. VIS was given.

## 2019-09-12 ENCOUNTER — Other Ambulatory Visit: Payer: Self-pay

## 2019-09-15 ENCOUNTER — Other Ambulatory Visit: Payer: Self-pay

## 2019-09-15 ENCOUNTER — Ambulatory Visit (INDEPENDENT_AMBULATORY_CARE_PROVIDER_SITE_OTHER): Payer: Self-pay | Admitting: Family Medicine

## 2019-09-15 VITALS — BP 120/72 | HR 87 | Temp 97.9°F | Resp 18 | Ht 61.5 in | Wt 287.7 lb

## 2019-09-15 DIAGNOSIS — O24419 Gestational diabetes mellitus in pregnancy, unspecified control: Secondary | ICD-10-CM

## 2019-09-15 DIAGNOSIS — M542 Cervicalgia: Secondary | ICD-10-CM

## 2019-09-15 DIAGNOSIS — G8929 Other chronic pain: Secondary | ICD-10-CM

## 2019-09-15 DIAGNOSIS — M5410 Radiculopathy, site unspecified: Secondary | ICD-10-CM

## 2019-09-15 MED ORDER — MELOXICAM 15 MG PO TABS: 15.0000 mg | ORAL_TABLET | Freq: Every day | ORAL | 1 refills | Status: AC

## 2019-09-15 MED ORDER — CYCLOBENZAPRINE HCL 10 MG PO TABS: 10.0000 mg | ORAL_TABLET | Freq: Three times a day (TID) | ORAL | 1 refills | Status: DC | PRN

## 2019-09-15 NOTE — Progress Notes (Signed)
Subjective:    Patient ID: Paige Anthony, female    DOB: [birth year], 43 y.o.   MRN: [id number]  Patient presents for Back Pain (started x2 years, lower back pain, ibuprofen was taken)  Pt has had back pain for the past 7 years. She has done accupunture on and off for years   Over the past year has had progress  She has constant numbnes son on left lateral  Leg for years  She had 5 epidural -vaginal deliveries  Chiropracter has helped past few days , they did xrays   She has used ibuprofen    Neck pain for the past 3 months as well, has spasms I neck  Unable to exercise due to pain in back and neck  Has hip pain as well, chiropracter told her hips were uneven   No change in bowel or bladder   Pt also with gestational diabets, has not had this checked since her last birth 1 year ago, has been increasing weight   Review Of Systems:  GEN- denies fatigue, fever, weight loss,weakness, recent illness HEENT- denies eye drainage, change in vision, nasal discharge, CVS- denies chest pain, palpitations RESP- denies SOB, cough, wheeze ABD- denies N/V, change in stools, abd pain GU- denies dysuria, hematuria, dribbling, incontinence MSK-+joint pain, muscle aches, injury Neuro- denies headache, dizziness, syncope, seizure activity       Objective:    BP 120/72 (BP Location: Right Arm, Patient Position: Sitting, Cuff Size: Large)   Pulse 87   Temp 97.9 F (36.6 C) (Oral)   Resp 18   Ht 5' 1.5" (1.562 m)   Wt 287 lb 11.2 oz (130.5 kg)   SpO2 97%   BMI 53.48 kg/m  GEN- NAD, alert and oriented x3 HEENT- PERRL, EOMI, non injected sclera, pink conjunctiva, MMM, oropharynx clear Neck- Supple, no thyromegaly, good ROM, +spasm cervical paraspinals and trapezius, mild TTP C spine CVS- RRR, no murmur RESP-CTAB MSK- TTP Lumbar spine, +spasm, fair ROM, neg SLR Neuro- normal tone LE, decreased sensation lateral left leg, grossly in tact in feet, strength equal bilat LE EXT-  No edema Pulses- Radial, DP- 2+        Assessment & Plan:      Problem List Items Addressed This Visit    None    Visit Diagnoses    Gestational diabetes mellitus (GDM), antepartum, gestational diabetes method of control unspecified    -  Primary   Check A1C   Relevant Orders   Comprehensive metabolic panel (Completed)   Hemoglobin A1c (Completed)   Back pain with radiculopathy       Chronic BACK AND neck pain, obtain xrays from chiropracter, I think she would benefit from MRI likey has disc protrusion with nerve impingment but pt is uninsured.  However she would like to see if she can pay for the cost of the MRI out-of-pocket.  We will give her meloxicam to take once a day along with Flexeril for the muscle spasms.  She is going to continue with the chiropractor for now.   Relevant Medications   cyclobenzaprine (FLEXERIL) 10 MG tablet   meloxicam (MOBIC) 15 MG tablet   Chronic neck pain       Relevant Medications   cyclobenzaprine (FLEXERIL) 10 MG tablet   meloxicam (MOBIC) 15 MG tablet      Note: This dictation was prepared with Dragon dictation along with smaller phrase technology. Any transcriptional errors that result from this process are unintentional.

## 2019-09-15 NOTE — Patient Instructions (Signed)
Release of records - [facility] - I need xray reports  Take anti-inflammatory Take muscle relaxer

## 2019-09-16 ENCOUNTER — Encounter: Payer: Self-pay | Admitting: Family Medicine

## 2019-09-16 LAB — COMPREHENSIVE METABOLIC PANEL
AG Ratio: 1.4 (calc) (ref 1.0–2.5)
ALT: 25 U/L (ref 6–29)
AST: 21 U/L (ref 10–30)
Albumin: 3.9 g/dL (ref 3.6–5.1)
Alkaline phosphatase (APISO): 110 U/L (ref 31–125)
BUN: 12 mg/dL (ref 7–25)
CO2: 24 mmol/L (ref 20–32)
Calcium: 9 mg/dL (ref 8.6–10.2)
Chloride: 104 mmol/L (ref 98–110)
Creat: 0.71 mg/dL (ref 0.50–1.10)
Globulin: 2.7 g/dL (calc) (ref 1.9–3.7)
Glucose, Bld: 238 mg/dL — ABNORMAL HIGH (ref 65–99)
Potassium: 3.9 mmol/L (ref 3.5–5.3)
Sodium: 137 mmol/L (ref 135–146)
Total Bilirubin: 0.8 mg/dL (ref 0.2–1.2)
Total Protein: 6.6 g/dL (ref 6.1–8.1)

## 2019-09-16 LAB — HEMOGLOBIN A1C
Hgb A1c MFr Bld: 8.2 % of total Hgb — ABNORMAL HIGH (ref <5.7)
Mean Plasma Glucose: 189 (calc)
eAG (mmol/L): 10.4 (calc)

## 2019-09-17 ENCOUNTER — Encounter: Payer: Self-pay | Admitting: *Deleted

## 2019-09-17 ENCOUNTER — Other Ambulatory Visit: Payer: Self-pay | Admitting: *Deleted

## 2019-09-17 MED ORDER — LANCETS MISC: 11 refills | Status: DC

## 2019-09-17 MED ORDER — METFORMIN HCL 500 MG PO TABS: ORAL_TABLET | ORAL | 3 refills | Status: DC

## 2019-09-17 MED ORDER — BLOOD GLUCOSE TEST VI STRP: ORAL_STRIP | 11 refills | Status: DC

## 2019-09-17 MED ORDER — BLOOD GLUCOSE SYSTEM PAK KIT: PACK | 1 refills | Status: AC

## 2019-10-04 ENCOUNTER — Other Ambulatory Visit: Payer: Medicaid Other

## 2019-11-04 ENCOUNTER — Ambulatory Visit: Payer: Medicaid Other | Admitting: Allergy and Immunology

## 2019-11-17 ENCOUNTER — Encounter: Payer: Self-pay | Admitting: Family Medicine

## 2019-11-17 ENCOUNTER — Telehealth (INDEPENDENT_AMBULATORY_CARE_PROVIDER_SITE_OTHER): Payer: Medicaid Other | Admitting: Family Medicine

## 2019-11-17 ENCOUNTER — Other Ambulatory Visit: Payer: Self-pay

## 2019-11-17 DIAGNOSIS — E119 Type 2 diabetes mellitus without complications: Secondary | ICD-10-CM

## 2019-11-17 DIAGNOSIS — G8929 Other chronic pain: Secondary | ICD-10-CM | POA: Insufficient documentation

## 2019-11-17 DIAGNOSIS — M545 Low back pain, unspecified: Secondary | ICD-10-CM

## 2019-11-17 DIAGNOSIS — M549 Dorsalgia, unspecified: Secondary | ICD-10-CM | POA: Insufficient documentation

## 2019-11-17 DIAGNOSIS — J069 Acute upper respiratory infection, unspecified: Secondary | ICD-10-CM

## 2019-11-17 MED ORDER — METFORMIN HCL 1000 MG PO TABS: ORAL_TABLET | ORAL | 3 refills | Status: DC

## 2019-11-17 NOTE — Progress Notes (Signed)
Virtual Visit via Telephone Note  I connected with Paige Anthony on 11/17/19 at 3:38pm  by telephone  (initially set up for video visit through my chart however we were unable to get this connected   )And verified that I am speaking with the correct person using two identifiers.     Pt location: at home   Physician location:  In office, [facility], Vic Blackbird MD     On call: patient and physician   I discussed the limitations, risks, security and privacy concerns of performing an evaluation and management service by telephone and the availability of in person appointments. I also discussed with the patient that there may be a patient responsible charge related to this service. The patient expressed understanding and agreed to proceed.   History of Present Illness: Scheduled for routine follow-up she had chronic back pain neck pain that we discussed as well as gestational diabetes her visit in September however she had URI symptoms so this was converted to telehealth visit.   She had chronic back pain is at 5 epidurals in the past.  Were trying to get MRI set up as she was self-pay she was also following with a chiropractor.  She was using Flexeril She would like to try to reschedule the MRI she is able to go at this time.  Her A1c was checked in September in setting of her history of gestational diabetes she is actually diabetic A1c was 8.3%.  She was started on Metformin 500 mg twice a day with goal of fasting blood sugars 80-120.  Her CBG initially were 240's fasting,148 today sghe has been taking metformin 500mg  in the morning and Glyburide  2.5mg  at night ( she had this left over from pregnancy) She did not realize she was to take twice a day  No hypoglycemia symptoms   Pt started with cough with congestion last Wed , she had positive COVID back in July  she has been staying at home, taking theraflu No fever, but runny nose. No body aches, no GI  symptoms, no loss of taste or smell. Symptoms improved, her 38month old also had cold symptoms   Observations/Objective: No acute distress noted over the phone Assessment and Plan:  DM- increase metformin 500mg  BID x 1 week, then increase to Metformin 1000BID   D/C glyburide   Chronic back pain- will set up MRI again, still using flexeril and mobic prn  Viral URI- already improved, she has been quarantining at home she did have positive Covid back in July I do not see any benefit in rescreening today.  We will follow-up in the office in 2 weeks for repeat fasting lab  Follow Up Instructions:    I discussed the assessment and treatment plan with the patient. The patient was provided an opportunity to ask questions and all were answered. The patient agreed with the plan and demonstrated an understanding of the instructions.   The patient was advised to call back or seek an in-person evaluation if the symptoms worsen or if the condition fails to improve as anticipated.  I provided 12  minutes of non-face-to-face time during this encounter. End Time: 3:50  Vic Blackbird, MD

## 2019-11-27 ENCOUNTER — Encounter: Payer: Self-pay | Admitting: Family Medicine

## 2019-12-05 ENCOUNTER — Other Ambulatory Visit: Payer: Self-pay

## 2019-12-05 DIAGNOSIS — E119 Type 2 diabetes mellitus without complications: Secondary | ICD-10-CM

## 2019-12-06 LAB — COMPREHENSIVE METABOLIC PANEL
AG Ratio: 1.3 (calc) (ref 1.0–2.5)
ALT: 30 U/L — ABNORMAL HIGH (ref 6–29)
AST: 22 U/L (ref 10–30)
Albumin: 3.9 g/dL (ref 3.6–5.1)
Alkaline phosphatase (APISO): 119 U/L (ref 31–125)
BUN: 15 mg/dL (ref 7–25)
CO2: 24 mmol/L (ref 20–32)
Calcium: 9.4 mg/dL (ref 8.6–10.2)
Chloride: 102 mmol/L (ref 98–110)
Creat: 0.61 mg/dL (ref 0.50–1.10)
Globulin: 2.9 g/dL (calc) (ref 1.9–3.7)
Glucose, Bld: 120 mg/dL — ABNORMAL HIGH (ref 65–99)
Potassium: 4.2 mmol/L (ref 3.5–5.3)
Sodium: 137 mmol/L (ref 135–146)
Total Bilirubin: 0.5 mg/dL (ref 0.2–1.2)
Total Protein: 6.8 g/dL (ref 6.1–8.1)

## 2019-12-06 LAB — LIPID PANEL
Cholesterol: 110 mg/dL (ref <200)
HDL: 45 mg/dL — ABNORMAL LOW (ref >=50)
LDL Cholesterol (Calc): 41 mg/dL (calc)
Non-HDL Cholesterol (Calc): 65 mg/dL (calc) (ref <130)
Total CHOL/HDL Ratio: 2.4 (calc) (ref <5.0)
Triglycerides: 161 mg/dL — ABNORMAL HIGH (ref <150)

## 2019-12-06 LAB — HEMOGLOBIN A1C
Hgb A1c MFr Bld: 7.7 % of total Hgb — ABNORMAL HIGH (ref <5.7)
Mean Plasma Glucose: 174 (calc)
eAG (mmol/L): 9.7 (calc)

## 2019-12-09 ENCOUNTER — Encounter: Payer: Self-pay | Admitting: Allergy and Immunology

## 2019-12-09 ENCOUNTER — Ambulatory Visit (INDEPENDENT_AMBULATORY_CARE_PROVIDER_SITE_OTHER): Payer: Self-pay | Admitting: Allergy and Immunology

## 2019-12-09 ENCOUNTER — Other Ambulatory Visit: Payer: Self-pay

## 2019-12-09 VITALS — BP 116/84 | HR 86 | Temp 98.1°F | Resp 18

## 2019-12-09 DIAGNOSIS — J3089 Other allergic rhinitis: Secondary | ICD-10-CM

## 2019-12-09 DIAGNOSIS — J4540 Moderate persistent asthma, uncomplicated: Secondary | ICD-10-CM

## 2019-12-09 NOTE — Progress Notes (Signed)
**Note Paige-Identified via Obfuscation** Paige Anthony - [place] - [place]   Follow-up Note  Referring Provider: Buelah Manis, Modena Nunnery, MD Primary Provider: Alycia Rossetti, MD Date of Office Visit: 12/09/2019  Subjective:   Paige Anthony (DOB: [birth year]) is a 43 y.o. female who returns to the [facility] on 12/09/2019 in re-evaluation of the following:  HPI: Paige Anthony returns to this clinic in reevaluation of asthma and allergic rhinitis and reflux.  Her last visit to this clinic was her initial evaluation of 05 August 2019 at which point in time we addressed all of these issues.  Overall her airway has really done well since her last visit in this clinic while intermittently using Symbicort and Nasacort.  She does not use these medications on a regular basis.  Having no insurance is one of the issues for why she does not use these medications on a regular basis.  She did develop an upper respiratory tract infection about 3 weeks ago and restarted Symbicort and had to use albuterol for about a week but otherwise has really done well since her last visit without the need for systemic steroid or antibiotic to treat any type of airway issue.  Her reflux is under very good control while using omeprazole.  She has been diagnosed with diabetes and is undergoing a weight loss program losing 20 pounds since September 2020 and has started Metformin.  She has also had some problems with back pain and possible radiculopathy that is being evaluated.  She did obtain the flu vaccine this year.  She was infected with Covid earlier this year.  Allergies as of 12/09/2019      Reactions   Latex Rash      Medication List      albuterol 90 MCG/ACT inhaler Commonly known as: PROVENTIL,VENTOLIN Inhale 2 puffs into the lungs every 6 (six) hours as needed. Shortness of breath   Blood Glucose System Pak Kit Use as directed to monitor FSBS 1x daily. Dx: E11.9.   BLOOD GLUCOSE TEST STRIPS  Strp Use as directed to monitor FSBS 1x daily. Dx: E11.9.   budesonide-formoterol 160-4.5 MCG/ACT inhaler Commonly known as: Symbicort Inhale 2 puffs into the lungs daily.   cetirizine 10 MG tablet Commonly known as: ZYRTEC Take 10 mg by mouth daily.   cyclobenzaprine 10 MG tablet Commonly known as: FLEXERIL Take 1 tablet (10 mg total) by mouth 3 (three) times daily as needed for muscle spasms.   ibuprofen 600 MG tablet Commonly known as: ADVIL Take 1 tablet (600 mg total) by mouth every 6 (six) hours as needed.   Lancets Misc Use as directed to monitor FSBS 1x daily. Dx: E11.9.   meloxicam 15 MG tablet Commonly known as: MOBIC Take 1 tablet (15 mg total) by mouth daily.   metFORMIN 1000 MG tablet Commonly known as: GLUCOPHAGE (1) tab PO BID with meals.   multivitamin tablet Take 1 tablet by mouth daily.   omeprazole 20 MG tablet Commonly known as: PriLOSEC OTC Take 1 tablet (20 mg total) by mouth daily.       Past Medical History:  Diagnosis Date  . Asthma   . Chlamydia   . Gestational diabetes   . Hypertension   . UTI (lower urinary tract infection)     Past Surgical History:  Procedure Laterality Date  . NO PAST SURGERIES      Review of systems negative except as noted in HPI / PMHx or noted below:  Review of  Systems  Constitutional: Negative.   HENT: Negative.   Eyes: Negative.   Respiratory: Negative.   Cardiovascular: Negative.   Gastrointestinal: Negative.   Genitourinary: Negative.   Musculoskeletal: Negative.   Skin: Negative.   Neurological: Negative.   Endo/Heme/Allergies: Negative.   Psychiatric/Behavioral: Negative.      Objective:   Vitals:   12/09/19 1026  BP: 116/84  Pulse: 86  Resp: 18  Temp: 98.1 F (36.7 C)  SpO2: 97%          Physical Exam Constitutional:      Appearance: She is not diaphoretic.  HENT:     Head: Normocephalic.     Right Ear: Tympanic membrane, ear canal and external ear normal.     Left  Ear: Tympanic membrane, ear canal and external ear normal.     Nose: Nose normal. No mucosal edema or rhinorrhea.     Mouth/Throat:     Pharynx: Uvula midline. No oropharyngeal exudate.  Eyes:     Conjunctiva/sclera: Conjunctivae normal.  Neck:     Thyroid: No thyromegaly.     Trachea: Trachea normal. No tracheal tenderness or tracheal deviation.  Cardiovascular:     Rate and Rhythm: Normal rate and regular rhythm.     Heart sounds: Normal heart sounds, S1 normal and S2 normal. No murmur.  Pulmonary:     Effort: No respiratory distress.     Breath sounds: Normal breath sounds. No stridor. No wheezing or rales.  Lymphadenopathy:     Head:     Right side of head: No tonsillar adenopathy.     Left side of head: No tonsillar adenopathy.     Cervical: No cervical adenopathy.  Skin:    Findings: No erythema or rash.     Nails: There is no clubbing.  Neurological:     Mental Status: She is alert.     Diagnostics:    Spirometry was performed and demonstrated an FEV1 of 2.20 at 71 % of predicted.  The patient had an Asthma Control Test with the following results: ACT Total Score: 23.    Assessment and Plan:   1. Asthma, moderate persistent, well-controlled   2. Perennial allergic rhinitis     1.  Allergen avoidance measures - dust mite  2.  Treat and prevent inflammation:   A. Symbicort 160 -2 inhalations 1 time per day (samples)  B.  OTC Nasacort-1 spray each nostril 3-7 times a week  3.  Treat and prevent reflux:   A.  Consolidate caffeine and chocolate consumption  B.  Omeprazole 40 mg - 1 tablet 1 time per day  3.  If needed:   A.  Albuterol HFA- 2 inhalations every 4-6 hours  B.  OTC Zyrtec/cetirizine 10 mg - 1 tablet 1 time per day  C.  OTC nasal saline  4.  Return to clinic in 6 months or earlier if problem  Stamatia appears to be doing very well regarding her airway with intermittent use of Symbicort and Nasacort and appears to be doing very well with her  reflux while using omeprazole on a consistent basis.  We have provided her some samples of these medications during today's visit.  I will see her back in this clinic in 6 months or earlier if there is a problem.  Allena Katz, MD Allergy / Immunology [place]

## 2019-12-09 NOTE — Patient Instructions (Addendum)
  1.  Allergen avoidance measures - dust mite  2.  Treat and prevent inflammation:   A. Symbicort 160 -2 inhalations 1 time per day (samples)  B.  OTC Nasacort-1 spray each nostril 3-7 times a week  3.  Treat and prevent reflux:   A.  Consolidate caffeine and chocolate consumption  B.  Omeprazole 40 mg - 1 tablet 1 time per day  3.  If needed:   A.  Albuterol HFA- 2 inhalations every 4-6 hours  B.  OTC Zyrtec/cetirizine 10 mg - 1 tablet 1 time per day  C.  OTC nasal saline  4.  Return to clinic in 6 months or earlier if problem

## 2019-12-10 ENCOUNTER — Encounter: Payer: Self-pay | Admitting: *Deleted

## 2019-12-10 ENCOUNTER — Encounter: Payer: Self-pay | Admitting: Allergy and Immunology

## 2020-06-08 ENCOUNTER — Encounter: Payer: Self-pay | Admitting: Allergy and Immunology

## 2020-06-08 ENCOUNTER — Ambulatory Visit (INDEPENDENT_AMBULATORY_CARE_PROVIDER_SITE_OTHER): Payer: Self-pay | Admitting: Allergy and Immunology

## 2020-06-08 ENCOUNTER — Other Ambulatory Visit: Payer: Self-pay

## 2020-06-08 VITALS — BP 120/86 | HR 101 | Resp 18 | Ht 65.0 in | Wt 267.2 lb

## 2020-06-08 DIAGNOSIS — K219 Gastro-esophageal reflux disease without esophagitis: Secondary | ICD-10-CM

## 2020-06-08 DIAGNOSIS — J4540 Moderate persistent asthma, uncomplicated: Secondary | ICD-10-CM

## 2020-06-08 DIAGNOSIS — J3089 Other allergic rhinitis: Secondary | ICD-10-CM

## 2020-06-08 MED ORDER — AIRDUO DIGIHALER 232-14 MCG/ACT IN AEPB: INHALATION_SPRAY | RESPIRATORY_TRACT | 5 refills | Status: DC

## 2020-06-08 NOTE — Patient Instructions (Addendum)
  1.  Allergen avoidance measures - dust mite  2.  Treat and prevent inflammation:   A. AirDuo 232 sample -1 inhalations 1 time per day   B.  OTC Nasacort-1 spray each nostril 3-7 times a week  3.  Treat and prevent reflux:   A.  Consolidate caffeine and chocolate consumption  B.  Omeprazole 40 mg - 1 tablet 1 time per day  3.  If needed:   A.  Albuterol HFA- 2 inhalations every 4-6 hours  B.  OTC Zyrtec/cetirizine 10 mg - 1 tablet 1 time per day  C.  OTC nasal saline  4.  Return to clinic in 6 months or earlier if problem

## 2020-06-08 NOTE — Progress Notes (Signed)
[facility] [place] [facility] [place] [facility] [place]   Follow-up Note   Referring Provider: Buelah Manis, Modena Nunnery, MD Primary Provider: Alycia Rossetti, MD Date of Office Visit: 06/08/2020  Subjective:   Paige Anthony (DOB: January 06, 1976) is a 44 y.o. female who returns to the [facility] on 06/08/2020 in re-evaluation of the following:  HPI: Paige Anthony returns to this clinic in reevaluation of asthma and allergic rhinitis and reflux.  Her last visit to this clinic was 09 December 2019.  She does very well when she uses a controller agent on a regular basis and when she runs out of these medications she developed some intermittent wheezing and coughing and must use a short acting bronchodilator.  Overall though, she has done well in the past 6 months in regard to avoiding a significant exacerbation of her asthma and has not required a systemic steroid or an antibiotic for any type of airway issue.  Her reflux is under very good control with omeprazole.  Allergies as of 06/08/2020      Reactions   Latex Rash      Medication List      albuterol 90 MCG/ACT inhaler Commonly known as: PROVENTIL,VENTOLIN Inhale 2 puffs into the lungs every 6 (six) hours as needed. Shortness of breath   Blood Glucose System Pak Kit Use as directed to monitor FSBS 1x daily. Dx: E11.9.   BLOOD GLUCOSE TEST STRIPS Strp Use as directed to monitor FSBS 1x daily. Dx: E11.9.   budesonide-formoterol 160-4.5 MCG/ACT inhaler Commonly known as: Symbicort Inhale 2 puffs into the lungs daily.   cetirizine 10 MG tablet Commonly known as: ZYRTEC Take 10 mg by mouth daily.   cyclobenzaprine 10 MG tablet Commonly known as: FLEXERIL Take 1 tablet (10 mg total) by mouth 3 (three) times daily as needed for muscle spasms.   ibuprofen 600 MG tablet Commonly known as: ADVIL Take 1 tablet (600 mg total) by mouth every 6 (six) hours as needed.   Lancets Misc Use as directed to  monitor FSBS 1x daily. Dx: E11.9.   meloxicam 15 MG tablet Commonly known as: MOBIC Take 1 tablet (15 mg total) by mouth daily.   metFORMIN 1000 MG tablet Commonly known as: GLUCOPHAGE (1) tab PO BID with meals.   multivitamin tablet Take 1 tablet by mouth daily.   omeprazole 20 MG tablet Commonly known as: PriLOSEC OTC Take 1 tablet (20 mg total) by mouth daily.       Past Medical History:  Diagnosis Date  . Asthma   . Chlamydia   . Gestational diabetes   . Hypertension   . UTI (lower urinary tract infection)     Past Surgical History:  Procedure Laterality Date  . NO PAST SURGERIES      Review of systems negative except as noted in HPI / PMHx or noted below:  Review of Systems  Constitutional: Negative.   HENT: Negative.   Eyes: Negative.   Respiratory: Negative.   Cardiovascular: Negative.   Gastrointestinal: Negative.   Genitourinary: Negative.   Musculoskeletal: Negative.   Skin: Negative.   Neurological: Negative.   Endo/Heme/Allergies: Negative.   Psychiatric/Behavioral: Negative.      Objective:   Vitals:   06/08/20 1032  BP: 120/86  Pulse: (!) 101  Resp: 18  SpO2: 96%   Height: '5\' 5"'  (165.1 cm)  Weight: 267 lb 3.2 oz (121.2 kg)   Physical Exam Constitutional:      Appearance: She is  not diaphoretic.  HENT:     Head: Normocephalic.     Right Ear: Tympanic membrane, ear canal and external ear normal.     Left Ear: Tympanic membrane, ear canal and external ear normal.     Nose: Nose normal. No mucosal edema or rhinorrhea.     Mouth/Throat:     Pharynx: Uvula midline. No oropharyngeal exudate.  Eyes:     Conjunctiva/sclera: Conjunctivae normal.  Neck:     Thyroid: No thyromegaly.     Trachea: Trachea normal. No tracheal tenderness or tracheal deviation.  Cardiovascular:     Rate and Rhythm: Normal rate and regular rhythm.     Heart sounds: Normal heart sounds, S1 normal and S2 normal. No murmur heard.   Pulmonary:     Effort: No  respiratory distress.     Breath sounds: Normal breath sounds. No stridor. No wheezing or rales.  Lymphadenopathy:     Head:     Right side of head: No tonsillar adenopathy.     Left side of head: No tonsillar adenopathy.     Cervical: No cervical adenopathy.  Skin:    Findings: No erythema or rash.     Nails: There is no clubbing.  Neurological:     Mental Status: She is alert.     Diagnostics:    Spirometry was performed and demonstrated an FEV1 of 2.58 at 85 % of predicted.  The patient had an Asthma Control Test with the following results: ACT Total Score: 25.    Assessment and Plan:   1. Asthma, moderate persistent, well-controlled   2. Perennial allergic rhinitis   3. Gastroesophageal reflux disease, unspecified whether esophagitis present     1.  Allergen avoidance measures - dust mite  2.  Treat and prevent inflammation:   A. AirDuo 232 sample -1 inhalations 1 time per day   B.  OTC Nasacort-1 spray each nostril 3-7 times a week  3.  Treat and prevent reflux:   A.  Consolidate caffeine and chocolate consumption  B.  Omeprazole 40 mg - 1 tablet 1 time per day  3.  If needed:   A.  Albuterol HFA- 2 inhalations every 4-6 hours  B.  OTC Zyrtec/cetirizine 10 mg - 1 tablet 1 time per day  C.  OTC nasal saline  4.  Return to clinic in 6 months or earlier if problem  I have given Paige Anthony a sample of air duo as we have run out of our samples of Symbicort.  She will continue on this controller agent as well as some occasional nasal steroid and continue to treat her reflux with omeprazole on a consistent basis.  I will see her back in his clinic in 6 months or earlier if there is a problem.  Allena Katz, MD Allergy / Immunology [place]

## 2020-06-09 ENCOUNTER — Encounter: Payer: Self-pay | Admitting: Allergy and Immunology

## 2020-09-09 ENCOUNTER — Ambulatory Visit (INDEPENDENT_AMBULATORY_CARE_PROVIDER_SITE_OTHER): Payer: Self-pay

## 2020-09-09 DIAGNOSIS — Z23 Encounter for immunization: Secondary | ICD-10-CM

## 2020-09-20 ENCOUNTER — Ambulatory Visit: Payer: Self-pay | Admitting: Family Medicine

## 2020-09-28 ENCOUNTER — Ambulatory Visit (INDEPENDENT_AMBULATORY_CARE_PROVIDER_SITE_OTHER): Payer: Self-pay | Admitting: Family Medicine

## 2020-09-28 ENCOUNTER — Encounter: Payer: Self-pay | Admitting: Family Medicine

## 2020-09-28 ENCOUNTER — Other Ambulatory Visit: Payer: Self-pay

## 2020-09-28 VITALS — BP 128/70 | HR 82 | Temp 97.8°F | Resp 14 | Ht 65.0 in | Wt 270.0 lb

## 2020-09-28 DIAGNOSIS — J4540 Moderate persistent asthma, uncomplicated: Secondary | ICD-10-CM

## 2020-09-28 DIAGNOSIS — G8929 Other chronic pain: Secondary | ICD-10-CM

## 2020-09-28 DIAGNOSIS — E669 Obesity, unspecified: Secondary | ICD-10-CM

## 2020-09-28 DIAGNOSIS — Z30431 Encounter for routine checking of intrauterine contraceptive device: Secondary | ICD-10-CM

## 2020-09-28 DIAGNOSIS — M545 Low back pain, unspecified: Secondary | ICD-10-CM

## 2020-09-28 DIAGNOSIS — J45909 Unspecified asthma, uncomplicated: Secondary | ICD-10-CM | POA: Insufficient documentation

## 2020-09-28 DIAGNOSIS — E119 Type 2 diabetes mellitus without complications: Secondary | ICD-10-CM

## 2020-09-28 MED ORDER — MIRENA (52 MG) 20 MCG/24HR IU IUD
1.0000 | INTRAUTERINE_SYSTEM | Freq: Once | INTRAUTERINE | 0 refills | Status: DC
Start: 2020-09-28 — End: 2023-10-26

## 2020-09-28 MED ORDER — CYCLOBENZAPRINE HCL 10 MG PO TABS
10.0000 mg | ORAL_TABLET | Freq: Three times a day (TID) | ORAL | 0 refills | Status: DC | PRN
Start: 2020-09-28 — End: 2021-08-02

## 2020-09-28 MED ORDER — BREO ELLIPTA 200-25 MCG/INH IN AEPB: INHALATION_SPRAY | RESPIRATORY_TRACT | 0 refills | Status: AC

## 2020-09-28 NOTE — Patient Instructions (Addendum)
Fasting blood sugar goal 80-120 [facility]  We will call with lab  Try the Breo F/U 3 months

## 2020-09-28 NOTE — Assessment & Plan Note (Signed)
She needs imaging but unable to afford Will give flexeril, refill mobic Continue to stay active  She is going to check into Medicaid elgibility

## 2020-09-28 NOTE — Assessment & Plan Note (Signed)
Check fasting labs Continue metformin  Continue with dietary changes

## 2020-09-28 NOTE — Assessment & Plan Note (Signed)
Given sample of Breo inhaler to dry Okay to use extra albuterol at home, same dosing verified  Continue allergy meds

## 2020-09-28 NOTE — Progress Notes (Signed)
Subjective:    Patient ID: Paige Anthony, female    DOB: 07/29/1976, 44 y.o.   MRN: [id number]  Patient presents for Follow-up (is fsating) and B LE Pain (x1 month- pain in AM, sharp pain in upper thighs and calf)  Pt here to f/u chronic medical problems  medications reviewed Last visit Nov 2020. DM- history of gestational diabets, then converted to type 2 diabets 7.7  Taking metformin 1000mg  BID, fasting CBG run 120-140 on average, but it had spiked 1 day 170's  GERD- taking omeprazole daily     Asthma-she is following with Dr. , recently, prescribed airduo , she ha chronic dry cough, she does not have albuterol, but son has extra she can use  She does use zyrtec and nasacort  The airduo is not helping but she cant afford to get anything just to try   Chronic back pain- she has mobic  for the past month, she had had pain in her legs, in her thighs, she gets tingling and numbness infeet  hands get swelling ' I had ordered MRI last year due to the same symptoms but she lost insurance and is stil uninsured   Mirena IUD inplace, she wants to have checked but cant get through to health dept, she states strings are causing discomfort to husband  Class 3 obesity- she trying to reduce portion sizes, cooking with healtheir oils, she exercises 3 days a week and does swimming lessons with son    Review Of Systems:  GEN- denies fatigue, fever, weight loss,weakness, recent illness HEENT- denies eye drainage, change in vision, nasal discharge, CVS- denies chest pain, palpitations RESP- denies SOB, cough, wheeze ABD- denies N/V, change in stools, abd pain GU- denies dysuria, hematuria, dribbling, incontinence MSK-+joint pain, muscle aches, injury Neuro- denies headache, dizziness, syncope, seizure activity       Objective:    BP 128/70   Pulse 82   Temp 97.8 F (36.6 C) (Temporal)   Resp 14   Ht 5\' 5"  (1.651 m)   Wt 270 lb (122.5 kg)   SpO2 96%   BMI 44.93  kg/m  GEN- NAD, alert and oriented x3,obese  HEENT- PERRL, EOMI, non injected sclera, pink conjunctiva, MMM, oropharynx clear Neck- Supple, no thyromegaly CVS- RRR, no murmur RESP-CTAB ABD-NABS,soft,NT,ND MSK- TTP Lumbar spine, +spasm, fair ROM, neg SLR Neuro- normal tone LE, decreased sensation lateral left leg, grossly in tact in feet, strength equal bilat LE EXT- No edema Pulses- Radial, DP- 2+        Assessment & Plan:      Problem List Items Addressed This Visit      Unprioritized   Asthma    Given sample of Breo Lucie Leather inhaler to dry Okay to use extra albuterol at home, same dosing verified  Continue allergy meds       Relevant Medications   fluticasone furoate-vilanterol (BREO ELLIPTA) 200-25 MCG/INH AEPB   Chronic back pain    She needs imaging but unable to afford Will give flexeril, refill mobic Continue to stay active  She is going to check into Medicaid elgibility      Relevant Medications   cyclobenzaprine (FLEXERIL) 10 MG tablet   Class 3 obesity   Relevant Orders   CBC with Differential/Platelet   Comprehensive metabolic panel   Hemoglobin A1c   Lipid panel   Type 2 diabetes mellitus without complication, without long-term current use of insulin (HCC) - Primary    Check fasting labs Continue metformin  Continue with dietary changes       Relevant Orders   CBC with Differential/Platelet   Comprehensive metabolic panel   Hemoglobin A1c   Lipid panel    Other Visit Diagnoses    IUD check up       Referral to GYN      Note: This dictation was prepared with Dragon dictation along with smaller phrase technology. Any transcriptional errors that result from this process are unintentional.

## 2020-09-29 LAB — CBC WITH DIFFERENTIAL/PLATELET
Absolute Monocytes: 599 cells/uL (ref 200–950)
Basophils Absolute: 56 cells/uL (ref 0–200)
Basophils Relative: 0.5 %
Eosinophils Absolute: 611 cells/uL — ABNORMAL HIGH (ref 15–500)
Eosinophils Relative: 5.5 %
HCT: 42.4 % (ref 35.0–45.0)
Hemoglobin: 13.5 g/dL (ref 11.7–15.5)
Lymphs Abs: 2531 cells/uL (ref 850–3900)
MCH: 28.9 pg (ref 27.0–33.0)
MCHC: 31.8 g/dL — ABNORMAL LOW (ref 32.0–36.0)
MCV: 90.8 fL (ref 80.0–100.0)
MPV: 10.4 fL (ref 7.5–12.5)
Monocytes Relative: 5.4 %
Neutro Abs: 7304 cells/uL (ref 1500–7800)
Neutrophils Relative %: 65.8 %
Platelets: 325 10*3/uL (ref 140–400)
RBC: 4.67 10*6/uL (ref 3.80–5.10)
RDW: 11.9 % (ref 11.0–15.0)
Total Lymphocyte: 22.8 %
WBC: 11.1 10*3/uL — ABNORMAL HIGH (ref 3.8–10.8)

## 2020-09-29 LAB — COMPREHENSIVE METABOLIC PANEL
AG Ratio: 1.5 (calc) (ref 1.0–2.5)
ALT: 16 U/L (ref 6–29)
AST: 16 U/L (ref 10–30)
Albumin: 4 g/dL (ref 3.6–5.1)
Alkaline phosphatase (APISO): 127 U/L — ABNORMAL HIGH (ref 31–125)
BUN: 13 mg/dL (ref 7–25)
CO2: 26 mmol/L (ref 20–32)
Calcium: 8.9 mg/dL (ref 8.6–10.2)
Chloride: 103 mmol/L (ref 98–110)
Creat: 0.6 mg/dL (ref 0.50–1.10)
Globulin: 2.6 g/dL (calc) (ref 1.9–3.7)
Glucose, Bld: 156 mg/dL — ABNORMAL HIGH (ref 65–99)
Potassium: 4.3 mmol/L (ref 3.5–5.3)
Sodium: 138 mmol/L (ref 135–146)
Total Bilirubin: 0.7 mg/dL (ref 0.2–1.2)
Total Protein: 6.6 g/dL (ref 6.1–8.1)

## 2020-09-29 LAB — HEMOGLOBIN A1C
Hgb A1c MFr Bld: 7.4 % of total Hgb — ABNORMAL HIGH (ref <5.7)
Mean Plasma Glucose: 166 (calc)
eAG (mmol/L): 9.2 (calc)

## 2020-09-29 LAB — LIPID PANEL
Cholesterol: 108 mg/dL (ref <200)
HDL: 46 mg/dL — ABNORMAL LOW (ref >=50)
LDL Cholesterol (Calc): 44 mg/dL (calc)
Non-HDL Cholesterol (Calc): 62 mg/dL (calc) (ref <130)
Total CHOL/HDL Ratio: 2.3 (calc) (ref <5.0)
Triglycerides: 97 mg/dL (ref <150)

## 2020-09-30 ENCOUNTER — Other Ambulatory Visit: Payer: Self-pay | Admitting: *Deleted

## 2020-09-30 MED ORDER — GLIPIZIDE 5 MG PO TABS: 5.0000 mg | ORAL_TABLET | Freq: Every day | ORAL | 3 refills | Status: DC

## 2020-10-14 ENCOUNTER — Encounter: Payer: Medicaid Other | Admitting: Advanced Practice Midwife

## 2020-10-25 ENCOUNTER — Other Ambulatory Visit: Payer: Self-pay | Admitting: Family Medicine

## 2020-10-25 NOTE — Telephone Encounter (Signed)
Please advise 

## 2020-10-26 ENCOUNTER — Other Ambulatory Visit: Payer: Self-pay | Admitting: Obstetrics and Gynecology

## 2020-10-26 DIAGNOSIS — Z1231 Encounter for screening mammogram for malignant neoplasm of breast: Secondary | ICD-10-CM

## 2020-11-09 ENCOUNTER — Other Ambulatory Visit: Payer: Self-pay

## 2020-11-09 ENCOUNTER — Ambulatory Visit
Admission: RE | Admit: 2020-11-09 | Discharge: 2020-11-09 | Disposition: A | Discharge status: Home / Self Care | Payer: No Typology Code available for payment source | Source: Ambulatory Visit | Attending: Obstetrics and Gynecology | Admitting: Obstetrics and Gynecology

## 2020-11-09 DIAGNOSIS — Z1231 Encounter for screening mammogram for malignant neoplasm of breast: Secondary | ICD-10-CM

## 2020-11-16 ENCOUNTER — Ambulatory Visit: Payer: Self-pay | Admitting: Family Medicine

## 2020-11-16 ENCOUNTER — Encounter: Payer: Self-pay | Admitting: Family Medicine

## 2020-11-16 ENCOUNTER — Other Ambulatory Visit: Payer: Self-pay | Admitting: Obstetrics and Gynecology

## 2020-11-16 ENCOUNTER — Other Ambulatory Visit: Payer: Self-pay

## 2020-11-16 VITALS — BP 148/82 | HR 92 | Temp 98.2°F | Resp 16

## 2020-11-16 DIAGNOSIS — E162 Hypoglycemia, unspecified: Secondary | ICD-10-CM

## 2020-11-16 DIAGNOSIS — R928 Other abnormal and inconclusive findings on diagnostic imaging of breast: Secondary | ICD-10-CM

## 2020-11-16 DIAGNOSIS — E119 Type 2 diabetes mellitus without complications: Secondary | ICD-10-CM

## 2020-11-16 LAB — GLUCOSE 16585
Glucose: 66 mg/dL (ref 65–99)
Glucose: 101 mg/dL — ABNORMAL HIGH (ref 65–99)

## 2020-11-16 MED ORDER — GLUCOSE 40 % PO GEL
1.0000 | Freq: Once | ORAL | Status: AC
  Administered 2020-11-16: 37.5 g via ORAL

## 2020-11-16 NOTE — Progress Notes (Signed)
   Subjective:    Patient ID: Paige Anthony, female    DOB: Aug 25, 1976, 44 y.o.   MRN: [id number]  Patient presents for Fatigue    Patient is here with her son for his well-child examination.  During the examination she began to become sweaty and weak.  She states that she has had a few episodes like this before where she felt like she is in a pass out and not, shaky.  Felt like she was having a hot flash.  For the last 1 occurred when she was exercising last week.  She is taking both her Metformin and glipizide this morning but has not had anything to eat.  She has not been able to check her blood sugars because of the cost of the test strips.  She denies any chest pain or shortness of breath.  I checked her EKG it was at 66 blood pressure was initially elevated in the 170s systolic.  SHe was given glucagon, water to drink After 15 to 20 minutes her glucose did improve to 128 and her symptoms resolved.    Review Of Systems:  GEN- denies fatigue, fever, weight loss,weakness, recent illness HEENT- denies eye drainage, change in vision, nasal discharge, CVS- denies chest pain, palpitations RESP- denies SOB, cough, wheeze ABD- denies N/V, change in stools, abd pain GU- denies dysuria, hematuria, dribbling, incontinence MSK- denies joint pain, muscle aches, injury Neuro- denies headache, dizziness, syncope, seizure activity       Objective:    BP (!) 148/82 (BP Location: Left Arm, Patient Position: Sitting, Cuff Size: Large)   Pulse 92   Temp 98.2 F (36.8 C) (Temporal)   Resp 16   SpO2 96%  GEN- NAD, alert and oriented x3 HEENT- PERRL, EOMI, non injected sclera, pink conjunctiva, MMM, oropharynx clear CVS- RRR, no murmur RESP-CTAB EXT- No edema Pulses- Radial 2+        Assessment & Plan:      Problem List Items Addressed This Visit      Unprioritized   Type 2 diabetes mellitus without complication, without long-term current use of insulin (HCC)    Other  Visit Diagnoses    Hypoglycemia    -  Primary   Acute hypoglycemia episode , advised to eat with medications, but for now will D/C glipizide, she has changed diet, and exercising, last A1C 7.4% in Oct  Her bp improved with resting and improvement in her symptoms There was no speech changes or TIA symptoms noted, no chest pain or SOB  Will try to get a cheaper meter from [employer] with test strips.     Relevant Medications   dextrose (GLUTOSE) 40 % oral gel 37.5 g (Completed)   Other Relevant Orders   Glucose, fingerstick (stat)   Glucose, fingerstick (stat)      Note: This dictation was prepared with Dragon dictation along with smaller phrase technology. Any transcriptional errors that result from this process are unintentional.

## 2020-11-16 NOTE — Patient Instructions (Signed)
Stop the glipizide  continuemetfomrin Eat with medications

## 2020-11-25 ENCOUNTER — Ambulatory Visit
Admission: RE | Admit: 2020-11-25 | Discharge: 2020-11-25 | Disposition: A | Discharge status: Home / Self Care | Payer: No Typology Code available for payment source | Source: Ambulatory Visit | Attending: Obstetrics and Gynecology | Admitting: Obstetrics and Gynecology

## 2020-11-25 ENCOUNTER — Other Ambulatory Visit: Payer: Self-pay

## 2020-11-25 DIAGNOSIS — R928 Other abnormal and inconclusive findings on diagnostic imaging of breast: Secondary | ICD-10-CM

## 2020-12-14 ENCOUNTER — Ambulatory Visit: Payer: Self-pay | Admitting: Allergy and Immunology

## 2020-12-14 ENCOUNTER — Other Ambulatory Visit: Payer: Self-pay

## 2020-12-14 VITALS — HR 82 | Temp 97.9°F | Resp 16 | Ht 65.0 in | Wt 271.2 lb

## 2020-12-14 DIAGNOSIS — J4540 Moderate persistent asthma, uncomplicated: Secondary | ICD-10-CM

## 2020-12-14 DIAGNOSIS — K219 Gastro-esophageal reflux disease without esophagitis: Secondary | ICD-10-CM

## 2020-12-14 DIAGNOSIS — J3089 Other allergic rhinitis: Secondary | ICD-10-CM

## 2020-12-14 NOTE — Patient Instructions (Addendum)
  1.  Allergen avoidance measures - dust mite  2.  Treat and prevent inflammation:   A.  AirDuo 232 samples -1 inhalations 1 time per day   B.  OTC Nasacort-1 spray each nostril 3-7 times a week  3.  Treat and prevent reflux:   A.  Consolidate caffeine and chocolate consumption  B.  Omeprazole 40 mg - 1 tablet 1 time per day  3.  If needed:   A.  Albuterol HFA- 2 inhalations every 4-6 hours  B.  OTC Zyrtec/cetirizine 10 mg - 1 tablet 1 time per day  C.  OTC nasal saline  4.  Return to clinic in 6 months or earlier if problem

## 2020-12-14 NOTE — Progress Notes (Signed)
[facility] [place] [facility] [place]   Follow-up Note  Referring Provider: Buelah Manis, Modena Nunnery, MD Primary Provider: Alycia Rossetti, MD Date of Office Visit: 12/14/2020  Subjective:   Paige Anthony (DOB: 25-Mar-1976) is a 44 y.o. female who returns to the [facility] on 12/14/2020 in re-evaluation of the following:  HPI: Amiliana returns to this clinic in reevaluation of asthma and allergic rhinitis and reflux.  Her last visit to this clinic was 08 June 2020.  She has really done well since her last visit without the need for systemic steroid or an antibiotic and rare use of a short acting bronchodilator.  She has had no problems with her nose.  Her reflux is under very good control on her current plan.  She has received 2 Covid vaccinations and the flu vaccine this year.  Allergies as of 12/14/2020      Reactions   Latex Rash      Medication List      AirDuo Digihaler 232-14 MCG/ACT Aepb Generic drug: Fluticasone-Salmeterol(sensor) 1 inhalation 1 time per day   albuterol 90 MCG/ACT inhaler Commonly known as: PROVENTIL,VENTOLIN Inhale 2 puffs into the lungs every 6 (six) hours as needed. Shortness of breath   Blood Glucose System Pak Kit Use as directed to monitor FSBS 1x daily. Dx: E11.9.   BLOOD GLUCOSE TEST STRIPS Strp Use as directed to monitor FSBS 1x daily. Dx: E11.9.   budesonide-formoterol 160-4.5 MCG/ACT inhaler Commonly known as: Symbicort Inhale 2 puffs into the lungs daily.   cetirizine 10 MG tablet Commonly known as: ZYRTEC Take 10 mg by mouth daily.   cyclobenzaprine 10 MG tablet Commonly known as: FLEXERIL Take 1 tablet (10 mg total) by mouth 3 (three) times daily as needed for muscle spasms.   glipiZIDE 5 MG tablet Commonly known as: GLUCOTROL Take 1 tablet (5 mg total) by mouth daily before breakfast.   ibuprofen 600 MG tablet Commonly known as: ADVIL Take 1 tablet (600 mg total) by mouth  every 6 (six) hours as needed.   Lancets Misc Use as directed to monitor FSBS 1x daily. Dx: E11.9.   meloxicam 15 MG tablet Commonly known as: MOBIC Take 1 tablet (15 mg total) by mouth daily.   metFORMIN 1000 MG tablet Commonly known as: GLUCOPHAGE (1) tab PO BID with meals.   metFORMIN 1000 MG tablet Commonly known as: GLUCOPHAGE THEN 1 TABLET TWICE A DAY WITH MEALS   Mirena (52 MG) 20 MCG/24HR IUD Generic drug: levonorgestrel 1 Intra Uterine Device (1 each total) by Intrauterine route once for 1 dose.   multivitamin tablet Take 1 tablet by mouth daily.   Nasacort Allergy 24HR 55 MCG/ACT Aero nasal inhaler Generic drug: triamcinolone Place 2 sprays into the nose daily.   omeprazole 20 MG tablet Commonly known as: PriLOSEC OTC Take 1 tablet (20 mg total) by mouth daily.       Past Medical History:  Diagnosis Date  . Asthma   . Chlamydia   . Gestational diabetes   . Hypertension   . UTI (lower urinary tract infection)     Past Surgical History:  Procedure Laterality Date  . NO PAST SURGERIES      Review of systems negative except as noted in HPI / PMHx or noted below:  Review of Systems  Constitutional: Negative.   HENT: Negative.   Eyes: Negative.   Respiratory: Negative.   Cardiovascular: Negative.   Gastrointestinal: Negative.   Genitourinary:  Negative.   Musculoskeletal: Negative.   Skin: Negative.   Neurological: Negative.   Endo/Heme/Allergies: Negative.   Psychiatric/Behavioral: Negative.      Objective:   Vitals:   12/14/20 1104  Pulse: 82  Resp: 16  Temp: 97.9 F (36.6 C)  SpO2: 96%   Height: '5\' 5"'  (165.1 cm)  Weight: 271 lb 3.2 oz (123 kg)   Physical Exam Constitutional:      Appearance: She is not diaphoretic.  HENT:     Head: Normocephalic.     Right Ear: Tympanic membrane, ear canal and external ear normal.     Left Ear: Tympanic membrane, ear canal and external ear normal.     Nose: Nose normal. No mucosal edema or  rhinorrhea.     Mouth/Throat:     Mouth: Oropharynx is clear and moist and mucous membranes are normal.     Pharynx: Uvula midline. No oropharyngeal exudate.  Eyes:     Conjunctiva/sclera: Conjunctivae normal.  Neck:     Thyroid: No thyromegaly.     Trachea: Trachea normal. No tracheal tenderness or tracheal deviation.  Cardiovascular:     Rate and Rhythm: Normal rate and regular rhythm.     Heart sounds: Normal heart sounds, S1 normal and S2 normal. No murmur heard.   Pulmonary:     Effort: No respiratory distress.     Breath sounds: Normal breath sounds. No stridor. No wheezing or rales.  Musculoskeletal:        General: No edema.  Lymphadenopathy:     Head:     Right side of head: No tonsillar adenopathy.     Left side of head: No tonsillar adenopathy.     Cervical: No cervical adenopathy.  Skin:    Findings: No erythema or rash.     Nails: There is no clubbing.  Neurological:     Mental Status: She is alert.     Diagnostics: none  Assessment and Plan:   1. Asthma, moderate persistent, well-controlled   2. Perennial allergic rhinitis   3. Gastroesophageal reflux disease, unspecified whether esophagitis present     1.  Allergen avoidance measures - dust mite  2.  Treat and prevent inflammation:   A.  AirDuo 232 samples -1 inhalations 1 time per day   B.  OTC Nasacort-1 spray each nostril 3-7 times a week  3.  Treat and prevent reflux:   A.  Consolidate caffeine and chocolate consumption  B.  Omeprazole 40 mg - 1 tablet 1 time per day  3.  If needed:   A.  Albuterol HFA- 2 inhalations every 4-6 hours  B.  OTC Zyrtec/cetirizine 10 mg - 1 tablet 1 time per day  C.  OTC nasal saline  4.  Return to clinic in 6 months or earlier if problem  Paige Anthony is doing very well and she will continue to utilize the plan noted above.  I gave her some more samples of air duo to utilize and she is certainly welcome to contact this clinic should her require more samples as  she moves forward.  Assuming she does well I will see her back in this clinic in 6 months or earlier if there is a problem.  Allena Katz, MD Allergy / Immunology [place]

## 2020-12-15 ENCOUNTER — Encounter: Payer: Self-pay | Admitting: Allergy and Immunology

## 2020-12-28 ENCOUNTER — Other Ambulatory Visit: Payer: Self-pay | Admitting: Family Medicine

## 2020-12-29 ENCOUNTER — Ambulatory Visit: Payer: Self-pay | Admitting: Family Medicine

## 2021-01-27 ENCOUNTER — Other Ambulatory Visit: Payer: Self-pay | Admitting: Family Medicine

## 2021-06-07 ENCOUNTER — Ambulatory Visit: Payer: Self-pay | Admitting: Allergy and Immunology

## 2021-08-02 ENCOUNTER — Ambulatory Visit (INDEPENDENT_AMBULATORY_CARE_PROVIDER_SITE_OTHER): Payer: Self-pay | Admitting: Allergy and Immunology

## 2021-08-02 ENCOUNTER — Other Ambulatory Visit: Payer: Self-pay

## 2021-08-02 VITALS — BP 124/72 | HR 82 | Temp 98.3°F | Ht 65.0 in | Wt 242.3 lb

## 2021-08-02 DIAGNOSIS — J3089 Other allergic rhinitis: Secondary | ICD-10-CM

## 2021-08-02 DIAGNOSIS — K219 Gastro-esophageal reflux disease without esophagitis: Secondary | ICD-10-CM

## 2021-08-02 DIAGNOSIS — J4540 Moderate persistent asthma, uncomplicated: Secondary | ICD-10-CM

## 2021-08-02 MED ORDER — CETIRIZINE HCL 10 MG PO TABS: 10.0000 mg | ORAL_TABLET | Freq: Every day | ORAL | 5 refills | Status: DC

## 2021-08-02 MED ORDER — OMEPRAZOLE 40 MG PO CPDR
40.0000 mg | DELAYED_RELEASE_CAPSULE | Freq: Every day | ORAL | 5 refills | Status: DC
Start: 2021-08-02 — End: 2022-04-25

## 2021-08-02 MED ORDER — AIRDUO DIGIHALER 232-14 MCG/ACT IN AEPB
INHALATION_SPRAY | RESPIRATORY_TRACT | 5 refills | Status: DC
Start: 2021-08-02 — End: 2023-10-26

## 2021-08-02 MED ORDER — TRIAMCINOLONE ACETONIDE 55 MCG/ACT NA AERO
1.0000 | INHALATION_SPRAY | Freq: Every day | NASAL | 5 refills | Status: DC
Start: 2021-08-02 — End: 2022-04-25

## 2021-08-02 NOTE — Progress Notes (Signed)
[place] [facility] [place] [facility] [place]   Follow-up Note  Referring Provider: Buelah Manis, Modena Nunnery, MD Primary Provider: Alycia Rossetti, MD Date of Office Visit: 08/02/2021  Subjective:   Paige Anthony (DOB: [birth year]) is a 45 y.o. female who returns to the [facility] on 08/02/2021 in re-evaluation of the following:  HPI: Aitanna returns to this clinic in evaluation of asthma and allergic rhinitis and reflux.  Her last visit to this clinic was 14 December 2020.  Overall she has done relatively well.  She does much better when she has medications and she can use them on a consistent basis.  Currently she only has albuterol.  She has not required a systemic steroid or an antibiotic for any type of airway issue.  Her reflux is under good control with occasional use of omeprazole.  She has received 3 COVID vaccinations.  She has lost approximately 4 pounds of weight this year.  Her hemoglobin A1c is from a high of 8.5 to currently 7.1.  Allergies as of 08/02/2021       Reactions   Latex Rash        Medication List    AirDuo Digihaler 232-14 MCG/ACT Aepb Generic drug: Fluticasone-Salmeterol(sensor) 1 inhalation 1 time per day   albuterol 90 MCG/ACT inhaler Commonly known as: PROVENTIL,VENTOLIN Inhale 2 puffs into the lungs every 6 (six) hours as needed. Shortness of breath   Blood Glucose System Pak Kit Use as directed to monitor FSBS 1x daily. Dx: E11.9.   budesonide-formoterol 160-4.5 MCG/ACT inhaler Commonly known as: Symbicort Inhale 2 puffs into the lungs daily.   cetirizine 10 MG tablet Commonly known as: ZYRTEC Take 10 mg by mouth daily.   glipiZIDE 5 MG tablet Commonly known as: GLUCOTROL TAKE 1 TABLET BY MOUTH DAILY BEFORE BREAKFAST.   ibuprofen 600 MG tablet Commonly known as: ADVIL Take 1 tablet (600 mg total) by mouth every 6 (six) hours as needed.   meloxicam 15 MG tablet Commonly known as:  MOBIC Take 1 tablet (15 mg total) by mouth daily.   metFORMIN 1000 MG tablet Commonly known as: GLUCOPHAGE (1) tab PO BID with meals.   metFORMIN 1000 MG tablet Commonly known as: GLUCOPHAGE THEN 1 TABLET TWICE A DAY WITH MEALS   Mirena (52 MG) 20 MCG/DAY Iud Generic drug: levonorgestrel 1 Intra Uterine Device (1 each total) by Intrauterine route once for 1 dose.   multivitamin tablet Take 1 tablet by mouth daily.   omeprazole 20 MG tablet Commonly known as: PriLOSEC OTC Take 1 tablet (20 mg total) by mouth daily.   OneTouch Delica Lancets 11X Misc USE TO CHECK FASTING BLOOD SUGAR EVERY DAY   OneTouch Verio test strip Generic drug: glucose blood USE TO CHECK FASTING BLOOD SUGAR ONCE DAILY   triamcinolone 55 MCG/ACT Aero nasal inhaler Commonly known as: NASACORT Place 2 sprays into the nose daily.        Past Medical History:  Diagnosis Date   Asthma    Chlamydia    Gestational diabetes    Hypertension    UTI (lower urinary tract infection)     Past Surgical History:  Procedure Laterality Date   NO PAST SURGERIES      Review of systems negative except as noted in HPI / PMHx or noted below:  Review of Systems  Constitutional: Negative.   HENT: Negative.    Eyes: Negative.   Respiratory: Negative.    Cardiovascular: Negative.  Gastrointestinal: Negative.   Genitourinary: Negative.   Musculoskeletal: Negative.   Skin: Negative.   Neurological: Negative.   Endo/Heme/Allergies: Negative.   Psychiatric/Behavioral: Negative.      Objective:   Vitals:   08/02/21 1524  BP: 124/72  Pulse: 82  Temp: 98.3 F (36.8 C)  SpO2: 96%   Height: '5\' 5"'  (165.1 cm)  Weight: 242 lb 4.8 oz (109.9 kg)   Physical Exam Constitutional:      Appearance: She is not diaphoretic.  HENT:     Head: Normocephalic.     Right Ear: Tympanic membrane, ear canal and external ear normal.     Left Ear: Tympanic membrane, ear canal and external ear normal.     Nose: Nose  normal. No mucosal edema or rhinorrhea.     Mouth/Throat:     Pharynx: Uvula midline. No oropharyngeal exudate.  Eyes:     Conjunctiva/sclera: Conjunctivae normal.  Neck:     Thyroid: No thyromegaly.     Trachea: Trachea normal. No tracheal tenderness or tracheal deviation.  Cardiovascular:     Rate and Rhythm: Normal rate and regular rhythm.     Heart sounds: Normal heart sounds, S1 normal and S2 normal. No murmur heard. Pulmonary:     Effort: No respiratory distress.     Breath sounds: Normal breath sounds. No stridor. No wheezing or rales.  Lymphadenopathy:     Head:     Right side of head: No tonsillar adenopathy.     Left side of head: No tonsillar adenopathy.     Cervical: No cervical adenopathy.  Skin:    Findings: No erythema or rash.     Nails: There is no clubbing.  Neurological:     Mental Status: She is alert.    Diagnostics: none  Assessment and Plan:   1. Asthma, moderate persistent, well-controlled   2. Perennial allergic rhinitis   3. Gastroesophageal reflux disease, unspecified whether esophagitis present     1.  Allergen avoidance measures - dust mite  2.  Treat and prevent inflammation:   A.  AirDuo 232 samples -1 inhalations 1 time per day   B.  OTC Nasacort-1 spray each nostril 3-7 times a week  3.  Treat and prevent reflux:   A.  Consolidate caffeine and chocolate consumption  B.  Omeprazole 40 mg - 1 tablet 1 time per day  3.  If needed:   A.  Albuterol HFA- 2 inhalations every 4-6 hours  B.  OTC Zyrtec/cetirizine 10 mg - 1 tablet 1 time per day  C.  OTC nasal saline  4.  Return to clinic in 6 months or earlier if problem  5. Obtain fall flu vaccine  Kris does well as long as she uses her medications directed against respiratory tract inflammation and reflux.  Assuming she does well with this plan we will see her back in his clinic in 6 months or earlier if there is a problem.  Allena Katz, MD Allergy / Immunology [place]

## 2021-08-02 NOTE — Patient Instructions (Addendum)
  1.  Allergen avoidance measures - dust mite  2.  Treat and prevent inflammation:   A.  AirDuo 232 samples -1 inhalations 1 time per day   B.  OTC Nasacort-1 spray each nostril 3-7 times a week  3.  Treat and prevent reflux:   A.  Consolidate caffeine and chocolate consumption  B.  Omeprazole 40 mg - 1 tablet 1 time per day  3.  If needed:   A.  Albuterol HFA- 2 inhalations every 4-6 hours  B.  OTC Zyrtec/cetirizine 10 mg - 1 tablet 1 time per day  C.  OTC nasal saline  4.  Return to clinic in 6 months or earlier if problem  5. Obtain fall flu vaccine

## 2021-08-03 ENCOUNTER — Encounter: Payer: Self-pay | Admitting: Allergy and Immunology

## 2021-10-05 ENCOUNTER — Ambulatory Visit: Payer: Self-pay

## 2022-02-07 ENCOUNTER — Ambulatory Visit: Payer: Self-pay | Admitting: Allergy and Immunology

## 2022-02-07 DIAGNOSIS — J309 Allergic rhinitis, unspecified: Secondary | ICD-10-CM

## 2022-04-25 ENCOUNTER — Ambulatory Visit (INDEPENDENT_AMBULATORY_CARE_PROVIDER_SITE_OTHER): Payer: Self-pay | Admitting: Allergy and Immunology

## 2022-04-25 ENCOUNTER — Encounter: Payer: Self-pay | Admitting: Allergy and Immunology

## 2022-04-25 VITALS — BP 126/74 | HR 73 | Temp 97.4°F | Resp 16 | Ht 65.0 in | Wt 237.6 lb

## 2022-04-25 DIAGNOSIS — J3089 Other allergic rhinitis: Secondary | ICD-10-CM

## 2022-04-25 DIAGNOSIS — K219 Gastro-esophageal reflux disease without esophagitis: Secondary | ICD-10-CM

## 2022-04-25 DIAGNOSIS — J4540 Moderate persistent asthma, uncomplicated: Secondary | ICD-10-CM

## 2022-04-25 MED ORDER — TRIAMCINOLONE ACETONIDE 55 MCG/ACT NA AERO: 1.0000 | INHALATION_SPRAY | Freq: Every day | NASAL | 11 refills | Status: AC

## 2022-04-25 MED ORDER — CETIRIZINE HCL 10 MG PO TABS: 10.0000 mg | ORAL_TABLET | Freq: Two times a day (BID) | ORAL | 11 refills | Status: AC | PRN

## 2022-04-25 MED ORDER — ALBUTEROL SULFATE HFA 108 (90 BASE) MCG/ACT IN AERS: 2.0000 | INHALATION_SPRAY | RESPIRATORY_TRACT | 1 refills | Status: DC | PRN

## 2022-04-25 MED ORDER — OMEPRAZOLE 40 MG PO CPDR: 40.0000 mg | DELAYED_RELEASE_CAPSULE | Freq: Every day | ORAL | 4 refills | Status: AC

## 2022-04-25 NOTE — Patient Instructions (Signed)
?  1.  Allergen avoidance measures - dust mite ? ?2.  Treat and prevent reflux: ? ? A.  Omeprazole 40 mg - 1 tablet 1 time per day ? ?3.  If needed: ? ? A.  Albuterol HFA- 2 inhalations every 4-6 hours ? B.  OTC Zyrtec/cetirizine 10 mg - 1 tablet 1 time per day ? C.  OTC nasal saline ? D.  OTC Nasacort-1 spray each nostril 3-7 times a week ? ?4.  Return to clinic in 12 months or earlier if problem ? ?  ? ?  ?

## 2022-04-25 NOTE — Progress Notes (Signed)
? ?[place] ? ? ?Follow-up Note ? ?Referring Provider: No ref. provider found ?Primary Provider: No primary care provider on file. ?Date of Office Visit: 04/25/2022 ? ?Subjective:  ? ?Paige Anthony (DOB: 1976-03-10) is a 46 y.o. female who returns to the [facility] on 04/25/2022 in re-evaluation of the following: ? ?HPI: Paige Anthony returns to this clinic in reevaluation of asthma, allergic rhinitis, and reflux.  Her last visit to this clinic was 02 August 2021. ? ?She has really done well with her asthma since her last visit.  She has not been using any controller agents at this point in time and in fact has been without any inhaled steroids for about 6 months and has done very well.  Rarely does she use a short acting bronchodilator.  She has not required a systemic steroid to treat an exacerbation. ? ?She had very little problems with her upper airways. ? ?Her reflux is under good control using omeprazole occasionally. ? ?Allergies as of 04/25/2022   ? ?   Reactions  ? Latex Rash  ? ?  ? ?  ?Medication List  ? ? ?AirDuo Digihaler 232-14 MCG/ACT Aepb ?Generic drug: Fluticasone-Salmeterol(sensor) ?1 inhalation 1 time per day ?  ?albuterol 90 MCG/ACT inhaler ?Commonly known as: PROVENTIL,VENTOLIN ?Inhale 2 puffs into the lungs every 6 (six) hours as needed. Shortness of breath ?  ?Blood Glucose System Pak Kit ?Use as directed to monitor FSBS 1x daily. Dx: E11.9. ?  ?Breo Ellipta 200-25 MCG/ACT Aepb ?Generic drug: fluticasone furoate-vilanterol ?1 puff daily for asthma ?  ?budesonide-formoterol 160-4.5 MCG/ACT inhaler ?Commonly known as: Symbicort ?Inhale 2 puffs into the lungs daily. ?  ?cetirizine 10 MG tablet ?Commonly known as: ZYRTEC ?Take 1 tablet (10 mg total) by mouth daily. ?  ?glipiZIDE 5 MG tablet ?Commonly known as: GLUCOTROL ?TAKE 1 TABLET BY MOUTH DAILY BEFORE BREAKFAST. ?  ?ibuprofen 600 MG tablet ?Commonly known as: ADVIL ?Take  1 tablet (600 mg total) by mouth every 6 (six) hours as needed. ?  ?meloxicam 15 MG tablet ?Commonly known as: MOBIC ?Take 1 tablet (15 mg total) by mouth daily. ?  ?metFORMIN 1000 MG tablet ?Commonly known as: GLUCOPHAGE ?THEN 1 TABLET TWICE A DAY WITH MEALS ?  ?Mirena (52 MG) 20 MCG/DAY Iud ?Generic drug: levonorgestrel ?1 Intra Uterine Device (1 each total) by Intrauterine route once for 1 dose. ?  ?multivitamin tablet ?Take 1 tablet by mouth daily. ?  ?omeprazole 40 MG capsule ?Commonly known as: PRILOSEC ?Take 1 capsule (40 mg total) by mouth daily. ?  ?OneTouch Delica Lancets 36U Misc ?USE TO CHECK FASTING BLOOD SUGAR EVERY DAY ?  ?OneTouch Verio test strip ?Generic drug: glucose blood ?USE TO CHECK FASTING BLOOD SUGAR ONCE DAILY ?  ?pioglitazone 30 MG tablet ?Commonly known as: ACTOS ?Take 15 mg by mouth daily. ?  ?triamcinolone 55 MCG/ACT Aero nasal inhaler ?Commonly known as: NASACORT ?Place 1 spray into the nose daily. ?  ? ?Past Medical History:  ?Diagnosis Date  ? Asthma   ? Chlamydia   ? Gestational diabetes   ? Hypertension   ? UTI (lower urinary tract infection)   ? ? ?Past Surgical History:  ?Procedure Laterality Date  ? NO PAST SURGERIES    ? ? ?Review of systems negative except as noted in HPI / PMHx or noted below: ? ?Review of Systems  ?Constitutional: Negative.   ?HENT: Negative.    ?Eyes: Negative.   ?  Respiratory: Negative.    ?Cardiovascular: Negative.   ?Gastrointestinal: Negative.   ?Genitourinary: Negative.   ?Musculoskeletal: Negative.   ?Skin: Negative.   ?Neurological: Negative.   ?Endo/Heme/Allergies: Negative.   ?Psychiatric/Behavioral: Negative.    ? ? ?Objective:  ? ?Vitals:  ? 04/25/22 0947  ?BP: 126/74  ?Pulse: 73  ?Resp: 16  ?Temp: (!) 97.4 ?F (36.3 ?C)  ?SpO2: 96%  ? ?Height: '5\' 5"'  (165.1 cm)  ?Weight: 237 lb 9.6 oz (107.8 kg)  ? ?Physical Exam ?Constitutional:   ?   Appearance: She is not diaphoretic.  ?HENT:  ?   Head: Normocephalic.  ?   Right Ear: Tympanic membrane, ear canal  and external ear normal.  ?   Left Ear: Tympanic membrane, ear canal and external ear normal.  ?   Nose: Nose normal. No mucosal edema or rhinorrhea.  ?   Mouth/Throat:  ?   Pharynx: Uvula midline. No oropharyngeal exudate.  ?Eyes:  ?   Conjunctiva/sclera: Conjunctivae normal.  ?Neck:  ?   Thyroid: No thyromegaly.  ?   Trachea: Trachea normal. No tracheal tenderness or tracheal deviation.  ?Cardiovascular:  ?   Rate and Rhythm: Normal rate and regular rhythm.  ?   Heart sounds: Normal heart sounds, S1 normal and S2 normal. No murmur heard. ?Pulmonary:  ?   Effort: No respiratory distress.  ?   Breath sounds: Normal breath sounds. No stridor. No wheezing or rales.  ?Lymphadenopathy:  ?   Head:  ?   Right side of head: No tonsillar adenopathy.  ?   Left side of head: No tonsillar adenopathy.  ?   Cervical: No cervical adenopathy.  ?Skin: ?   Findings: No erythema or rash.  ?   Nails: There is no clubbing.  ?Neurological:  ?   Mental Status: She is alert.  ? ? ?Diagnostics:  ?  ?Spirometry was performed and demonstrated an FEV1 of 1.80 at 65 % of predicted. ? ?Assessment and Plan:  ? ?1. Asthma, moderate persistent, well-controlled   ?2. Perennial allergic rhinitis   ?3. Gastroesophageal reflux disease, unspecified whether esophagitis present   ? ? ?1.  Allergen avoidance measures - dust mite ? ?2.  Treat and prevent reflux: ? ? A.  Omeprazole 40 mg - 1 tablet 1 time per day ? ?3.  If needed: ? ? A.  Albuterol HFA- 2 inhalations every 4-6 hours ? B.  OTC Zyrtec/cetirizine 10 mg - 1 tablet 1 time per day ? C.  OTC nasal saline ? D.  OTC Nasacort-1 spray each nostril 3-7 times a week ? ?4.  Return to clinic in 12 months or earlier if problem ? ?Paige Anthony is really doing very well with minimal amounts of medications directed against her atopic disease at this point in time.  We will see how things go over the course the next 12 months and if there is a problem regarding control of her asthma we will need to reinitiate the  use of a controller agent utilized on a consistent basis.  ? ?Allena Katz, MD ?Allergy / Immunology ?[place] ?

## 2022-04-26 ENCOUNTER — Encounter: Payer: Self-pay | Admitting: Allergy and Immunology

## 2022-07-06 ENCOUNTER — Telehealth: Payer: Self-pay | Admitting: *Deleted

## 2022-10-16 ENCOUNTER — Encounter: Payer: Self-pay | Admitting: *Deleted

## 2022-12-07 ENCOUNTER — Encounter: Payer: No Typology Code available for payment source | Admitting: Obstetrics and Gynecology

## 2023-01-04 ENCOUNTER — Ambulatory Visit (INDEPENDENT_AMBULATORY_CARE_PROVIDER_SITE_OTHER): Payer: Self-pay | Admitting: Obstetrics and Gynecology

## 2023-01-04 ENCOUNTER — Encounter: Payer: Self-pay | Admitting: Obstetrics and Gynecology

## 2023-01-04 ENCOUNTER — Other Ambulatory Visit (HOSPITAL_COMMUNITY)
Admission: RE | Admit: 2023-01-04 | Discharge: 2023-01-04 | Disposition: A | Discharge status: Home / Self Care | Payer: BLUE CROSS/BLUE SHIELD | Source: Ambulatory Visit | Attending: Obstetrics and Gynecology | Admitting: Obstetrics and Gynecology

## 2023-01-04 VITALS — Ht 65.0 in | Wt 226.6 lb

## 2023-01-04 DIAGNOSIS — N939 Abnormal uterine and vaginal bleeding, unspecified: Secondary | ICD-10-CM | POA: Diagnosis present

## 2023-01-04 DIAGNOSIS — Z0000 Encounter for general adult medical examination without abnormal findings: Secondary | ICD-10-CM

## 2023-01-04 NOTE — Progress Notes (Signed)
NEW GYNECOLOGY PATIENT Patient name: Paige Anthony MRN [id number]  Date of birth: 1976-06-20 Chief Complaint:   Gynecologic Exam     History:  Paige Anthony is a 47 y.o. [id number] being seen today for bleeding and polyp.    1/8 menses, early Bleeds 7d, heavy for 3d, changing pads q1hr Cramps are through her legs for 2-3 days  1.5 years of heavy and painful periods. Last physical had this complaint but told she ahs a polyp but referral to gyn. Normal period sbefore. Vennie Homans x2 years after last baby, IUD removed and bleeding about 2-3 mon after. IUD x3 and the last one felt that stomach was bigger and pfinul and releived after removal and chronic vaginal discharge. Not using anything for contracpetion.         Gynecologic History Patient's last menstrual period was 12/25/2022 (exact date). Contraception: none Last Pap: 10/2020. Result was normal with negative HPV Last Mammogram: 11/2020.  Birads 2 Last Colonoscopy: n/a  Obstetric History OB History  Gravida Para Term Preterm AB Living  5 5 5     5   SAB IAB Ectopic Multiple Live Births        0 5    # Outcome Date GA Lbr Len/2nd Weight Sex Delivery Anes PTL Lv  5 Term 05/09/18 [redacted]w[redacted]d 04:24 / 00:09 8 lb 1.6 oz (3.675 kg) M Vag-Spont EPI  LIV     Birth Comments: WNL  4 Term 08/06/11 [redacted]w[redacted]d 06:53 / 00:07 6 lb 15.8 oz (3.17 kg) M Vag-Spont EPI  LIV  3 Term 01/22/05 [redacted]w[redacted]d  8 lb 6 oz (3.799 kg) M Vag-Spont EPI  LIV  2 Term 04/29/01 [redacted]w[redacted]d  7 lb 4.4 oz (3.3 kg) M Vag-Spont EPI  LIV  1 Term 07/13/98 [redacted]w[redacted]d  6 lb 4 oz (2.835 kg) M Vag-Spont   LIV    Past Medical History:  Diagnosis Date   Asthma    Chlamydia    Gestational diabetes    Hypertension    UTI (lower urinary tract infection)     Past Surgical History:  Procedure Laterality Date   NO PAST SURGERIES      Current Outpatient Medications on File Prior to Visit  Medication Sig Dispense Refill   albuterol (PROVENTIL,VENTOLIN) 90 MCG/ACT inhaler  Inhale 2 puffs into the lungs every 6 (six) hours as needed. Shortness of breath      albuterol (VENTOLIN HFA) 108 (90 Base) MCG/ACT inhaler Inhale 2 puffs into the lungs every 4 (four) hours as needed for wheezing or shortness of breath. 18 g 1   Blood Glucose Monitoring Suppl (BLOOD GLUCOSE SYSTEM PAK) KIT Use as directed to monitor FSBS 1x daily. Dx: E11.9. 1 kit 1   budesonide-formoterol (SYMBICORT) 160-4.5 MCG/ACT inhaler Inhale 2 puffs into the lungs daily. 1 Inhaler 5   fluticasone furoate-vilanterol (BREO ELLIPTA) 200-25 MCG/INH AEPB 1 puff daily for asthma 30 each 0   Fluticasone-Salmeterol,sensor, (AIRDUO DIGIHALER) 232-14 MCG/ACT AEPB 1 inhalation 1 time per day 1 each 5   glipiZIDE (GLUCOTROL) 5 MG tablet TAKE 1 TABLET BY MOUTH DAILY BEFORE BREAKFAST. 90 tablet 1   ibuprofen (ADVIL,MOTRIN) 600 MG tablet Take 1 tablet (600 mg total) by mouth every 6 (six) hours as needed. 30 tablet 0   meloxicam (MOBIC) 15 MG tablet Take 1 tablet (15 mg total) by mouth daily. 30 tablet 1   metFORMIN (GLUCOPHAGE) 1000 MG tablet THEN 1 TABLET TWICE A DAY WITH MEALS 180 tablet 3   Multiple  Vitamin (MULTIVITAMIN) tablet Take 1 tablet by mouth daily.     omeprazole (PRILOSEC) 40 MG capsule Take 1 capsule (40 mg total) by mouth daily. 90 capsule 4   OneTouch Delica Lancets 33G MISC USE TO CHECK FASTING BLOOD SUGAR EVERY DAY 100 each 11   ONETOUCH VERIO test strip USE TO CHECK FASTING BLOOD SUGAR ONCE DAILY 50 strip 8   pioglitazone (ACTOS) 30 MG tablet Take 15 mg by mouth daily.     triamcinolone (NASACORT) 55 MCG/ACT AERO nasal inhaler Place 1 spray into the nose daily. 17 g 11   cetirizine (ZYRTEC) 10 MG tablet Take 1 tablet (10 mg total) by mouth 2 (two) times daily as needed for allergies (Can take an extra dose during flare ups.). 60 tablet 11   levonorgestrel (MIRENA, 52 MG,) 20 MCG/24HR IUD 1 Intra Uterine Device (1 each total) by Intrauterine route once for 1 dose. 1 each 0   No current  facility-administered medications on file prior to visit.    Allergies  Allergen Reactions   Latex Rash    Social History:  reports that she has never smoked. She has never used smokeless tobacco. She reports that she does not drink alcohol and does not use drugs.  Family History  Problem Relation Age of Onset   Allergic rhinitis Neg Hx    Angioedema Neg Hx    Asthma Neg Hx    Atopy Neg Hx    Eczema Neg Hx    Immunodeficiency Neg Hx    Urticaria Neg Hx     The following portions of the patient's history were reviewed and updated as appropriate: allergies, current medications, past family history, past medical history, past social history, past surgical history and problem list.  Review of Systems Pertinent items noted in HPI and remainder of comprehensive ROS otherwise negative.  Physical Exam:  Ht 5\' 5"  (1.651 m)   Wt 226 lb 9.6 oz (102.8 kg)   LMP 12/25/2022 (Exact Date)   BMI 37.71 kg/m  Physical Exam Vitals and nursing note reviewed. Exam conducted with a chaperone present.  Constitutional:      Appearance: Normal appearance.  Cardiovascular:     Rate and Rhythm: Normal rate.  Pulmonary:     Effort: Pulmonary effort is normal.     Breath sounds: Normal breath sounds.  Genitourinary:    General: Normal vulva.     Exam position: Lithotomy position.     Comments: Normal cervix, polypoid lesion from within cervix Neurological:     General: No focal deficit present.     Mental Status: She is alert and oriented to person, place, and time.  Psychiatric:        Mood and Affect: Mood normal.        Behavior: Behavior normal.        Thought Content: Thought content normal.        Judgment: Judgment normal.    CERVICAL POLYPECTOMY PROCEDURE Patient identified, informed consent performed,  indication reviewed, consent signed.  Reviewed risk of perforation, pain, bleeding, insufficient sample, etc were reviewd. Time out was performed.  Urine pregnancy test  negative. Speculum placed in the vagina.  Cervix visualized. Polyp was visualizedGrasped with ring forceps, twisted and removed. Hemostatic and toelrated procedure well     Assessment and Plan:   1. Preventative health care Ordered - MM 3D SCREEN BREAST BILATERAL; Future  2. Abnormal uterine bleeding (AUB) Now s/p uncomplicated polypectomy.  Later noted does not have insurance and therefore would  have to pay for [place] - will observe bleeding/menses s/py polypectomy. If improved, will continue to monitor, if worsens or unchanged, will need [place] as part of workup in addition to additional AUB labs.  - Surgical pathology( [facility]/ [facility]) - US PELVIC COMPLETE WITH TRANSVAGINAL; Future    Routine preventative health maintenance measures emphasized. Please refer to After Visit Summary for other counseling recommendations.   Follow-up: No follow-ups on file.      Darliss Cheney, MD Obstetrician & Gynecologist, [facility] [employer], [place]

## 2023-01-08 LAB — SURGICAL PATHOLOGY

## 2023-01-09 ENCOUNTER — Telehealth: Payer: Self-pay

## 2023-01-09 NOTE — Telephone Encounter (Incomplete Revision)
-----  Message from Paige Cheney, MD sent at 01/09/2023  7:15 AM EST ----- Contact and notify that polyp removed in clinic is benign. Monitor bleeding - if it remains heavy and painful, return for remaining workup and will need ultrasound  Called pt with interpreter Claudia. Results and provider recommendation reviewed. Since polyp removal pt reports abdominal cramps. Pt inquired about [place] and I explained we will reschedule [place] if symptoms continue.

## 2023-01-09 NOTE — Telephone Encounter (Addendum)
-----   Message from Lorriane Shire, MD sent at 01/09/2023  7:15 AM EST ----- Contact and notify that polyp removed in clinic is benign. Monitor bleeding - if it remains heavy and painful, return for remaining workup and will need ultrasound  Called pt with interpreter Claudia. Results and provider recommendation reviewed. Since polyp removal pt reports abdominal cramps. Pt inquired about [place]. Reviewed with [facility] who states US is recommended, but patient may decline if not financially able to pay for this. Pt asks self pay cost. Explained to patient I will need to find this out and call back. [facility] notified to reschedule patient's mammogram as this was cancelled in error.

## 2023-01-10 ENCOUNTER — Telehealth: Payer: Self-pay

## 2023-01-10 NOTE — Telephone Encounter (Signed)
Telephoned patient at mobile number using language line interpreter [phone].  Left a voice message with [facility] contact information.

## 2023-01-16 NOTE — Telephone Encounter (Signed)
With self-pay discount [place] will cost $722 and reading of [place] by [facility] will be an additional $273. Called pt; VM left by interpreter Rosemarie Ax stating I am calling regarding cost of [place].

## 2023-01-18 ENCOUNTER — Ambulatory Visit: Payer: Self-pay

## 2023-01-22 NOTE — Telephone Encounter (Signed)
Called pt; cost of [place] reviewed. Recommended patient fill out financial application. Pt plans to come to front office to pick up.

## 2023-01-24 NOTE — Telephone Encounter (Signed)
Telephoned patient using Paige Anthony (interpreter). Patient has private insurance and will call and schedule mammogram appointment using coverage. Mammogram scholarship fund is not needed at this time.

## 2023-07-24 ENCOUNTER — Ambulatory Visit (INDEPENDENT_AMBULATORY_CARE_PROVIDER_SITE_OTHER): Payer: BLUE CROSS/BLUE SHIELD | Admitting: Allergy and Immunology

## 2023-07-24 VITALS — BP 128/82 | HR 81 | Temp 98.0°F | Resp 16 | Ht 65.0 in | Wt 202.4 lb

## 2023-07-24 DIAGNOSIS — J3089 Other allergic rhinitis: Secondary | ICD-10-CM

## 2023-07-24 DIAGNOSIS — J4540 Moderate persistent asthma, uncomplicated: Secondary | ICD-10-CM

## 2023-07-24 DIAGNOSIS — R42 Dizziness and giddiness: Secondary | ICD-10-CM

## 2023-07-24 DIAGNOSIS — K219 Gastro-esophageal reflux disease without esophagitis: Secondary | ICD-10-CM

## 2023-07-24 NOTE — Progress Notes (Unsigned)
[place] - [place] - [place]   Follow-up Note  Referring Provider: No ref. provider found Primary Provider: No primary care provider on file. Date of Office Visit: 07/24/2023  Subjective:   Paige Anthony (DOB: 1976/12/04) is a 47 y.o. female who [facility] in re-evaluation of the following:  HPI: Paige Anthony returns to this clinic in evaluation of asthma, allergic rhinitis, reflux.  I last saw her in this clinic 25 Apr 2022.  Her asthma has been relatively nonexistent and she rarely uses a short acting bronchodilator and she has not required a systemic steroid to treat an exacerbation.  Likewise, her upper airway is really been doing quite well and she has not required any antibiotic to treat an episode of sinusitis.  She intermittently uses some nasal steroid and intermittently uses an antihistamine.  She is very satisfied with the response she is received with her current treatment.  She believes that her reflux is under very good control by using omeprazole just a few times per week.  She has been having problems with vertigo and is making her vomit.  She did not stuck in this episode for about a week and she saw her primary care doctor for this issue.  Sometimes she has no dizziness at all and sometimes she has to pull over on the side of the road because of dizziness.  Allergies as of 07/24/2023       Reactions   Latex Rash        Medication List    AirDuo Digihaler 232-14 MCG/ACT Aepb Generic drug: Fluticasone-Salmeterol(sensor) 1 inhalation 1 time per day   albuterol 108 (90 Base) MCG/ACT inhaler Commonly known as: Ventolin HFA Inhale 2 puffs into the lungs every 4 (four) hours as needed for wheezing or shortness of breath.   albuterol 90 MCG/ACT inhaler Commonly known as: PROVENTIL,VENTOLIN Inhale 2 puffs into the lungs every 6 (six) hours as needed. Shortness of breath   Blood  Glucose System Pak Kit Use as directed to monitor FSBS 1x daily. Dx: E11.9.   Breo Ellipta 200-25 MCG/INH Aepb Generic drug: fluticasone furoate-vilanterol 1 puff daily for asthma   budesonide-formoterol 160-4.5 MCG/ACT inhaler Commonly known as: Symbicort Inhale 2 puffs into the lungs daily.   cetirizine 10 MG tablet Commonly known as: ZYRTEC Take 1 tablet (10 mg total) by mouth 2 (two) times daily as needed for allergies (Can take an extra dose during flare ups.).   glipiZIDE 5 MG tablet Commonly known as: GLUCOTROL TAKE 1 TABLET BY MOUTH DAILY BEFORE BREAKFAST.   ibuprofen 600 MG tablet Commonly known as: ADVIL Take 1 tablet (600 mg total) by mouth every 6 (six) hours as needed.   meloxicam 15 MG tablet Commonly known as: MOBIC Take 1 tablet (15 mg total) by mouth daily.   metFORMIN 1000 MG tablet Commonly known as: GLUCOPHAGE THEN 1 TABLET TWICE A DAY WITH MEALS   Mirena (52 MG) 20 MCG/24HR Iud Generic drug: levonorgestrel 1 Intra Uterine Device (1 each total) by Intrauterine route once for 1 dose.   multivitamin tablet Take 1 tablet by mouth daily.   omeprazole 40 MG capsule Commonly known as: PRILOSEC Take 1 capsule (40 mg total) by mouth daily.   OneTouch Delica Lancets 33G Misc USE TO CHECK FASTING BLOOD SUGAR EVERY DAY   OneTouch Verio test strip Generic drug: glucose blood USE TO CHECK FASTING BLOOD SUGAR ONCE DAILY   pioglitazone 30  MG tablet Commonly known as: ACTOS Take 15 mg by mouth daily.   triamcinolone 55 MCG/ACT Aero nasal inhaler Commonly known as: NASACORT Place 1 spray into the nose daily.      Past Medical History:  Diagnosis Date   Asthma    Chlamydia    Gestational diabetes    Hypertension    UTI (lower urinary tract infection)     Past Surgical History:  Procedure Laterality Date   NO PAST SURGERIES      Review of systems negative except as noted in HPI / PMHx or noted below:  Review of Systems  Constitutional:  Negative.   HENT: Negative.    Eyes: Negative.   Respiratory: Negative.    Cardiovascular: Negative.   Gastrointestinal: Negative.   Genitourinary: Negative.   Musculoskeletal: Negative.   Skin: Negative.   Neurological: Negative.   Endo/Heme/Allergies: Negative.   Psychiatric/Behavioral: Negative.       Objective:   Vitals:   07/24/23 1414  BP: 128/82  Pulse: 81  Resp: 16  Temp: 98 F (36.7 C)  SpO2: 97%   Height: 5\' 5"  (165.1 cm)  Weight: 202 lb 6.4 oz (91.8 kg)   Physical Exam Constitutional:      Appearance: She is not diaphoretic.  HENT:     Head: Normocephalic.     Right Ear: Tympanic membrane, ear canal and external ear normal.     Left Ear: Tympanic membrane, ear canal and external ear normal.     Nose: Nose normal. No mucosal edema or rhinorrhea.     Mouth/Throat:     Pharynx: Uvula midline. No oropharyngeal exudate.  Eyes:     Conjunctiva/sclera: Conjunctivae normal.  Neck:     Thyroid: No thyromegaly.     Trachea: Trachea normal. No tracheal tenderness or tracheal deviation.  Cardiovascular:     Rate and Rhythm: Normal rate and regular rhythm.     Heart sounds: Normal heart sounds, S1 normal and S2 normal. No murmur heard. Pulmonary:     Effort: No respiratory distress.     Breath sounds: Normal breath sounds. No stridor. No wheezing or rales.  Lymphadenopathy:     Head:     Right side of head: No tonsillar adenopathy.     Left side of head: No tonsillar adenopathy.     Cervical: No cervical adenopathy.  Skin:    Findings: No erythema or rash.     Nails: There is no clubbing.  Neurological:     Mental Status: She is alert.     Diagnostics:    Spirometry was performed and demonstrated an FEV1 of 2.10 at 76 % of predicted.  The patient had an Asthma Control Test with the following results:  .    Assessment and Plan:   No diagnosis found.  Patient Instructions   1.  Allergen avoidance measures - dust mite  2.  Treat and prevent  reflux:   A.  Omeprazole 40 mg - 1 tablet 3-7 times per week  3.  If needed:   A.  Albuterol HFA- 2 inhalations every 4-6 hours  B.  OTC Zyrtec/cetirizine 10 mg - 1 tablet 1 time per day  C.  OTC nasal saline  D.  OTC Nasacort / Nasonex -1 spray each nostril 3-7 times a week  4.  Return to clinic in 12 months or earlier if problem  5. Plan for fall flu vaccine  6. Visit with primary doctor about dizziness. May need to see ENT if  continues.         Laurette Schimke, MD Allergy / Immunology [facility]

## 2023-07-24 NOTE — Patient Instructions (Addendum)
  1.  Allergen avoidance measures - dust mite  2.  Treat and prevent reflux:   A.  Omeprazole 40 mg - 1 tablet 3-7 times per week  3.  If needed:   A.  Albuterol HFA- 2 inhalations every 4-6 hours  B.  OTC Zyrtec/cetirizine 10 mg - 1 tablet 1 time per day  C.  OTC nasal saline  D.  OTC Nasacort / Nasonex -1 spray each nostril 3-7 times a week  4.  Return to clinic in 12 months or earlier if problem  5. Plan for fall flu vaccine  6. Visit with primary doctor about dizziness. May need to see ENT if continues.

## 2023-07-25 ENCOUNTER — Encounter: Payer: Self-pay | Admitting: Allergy and Immunology

## 2023-08-03 NOTE — Addendum Note (Signed)
Addended by: Rolland Bimler D on: 08/03/2023 03:22 PM   Modules accepted: Orders

## 2023-10-26 ENCOUNTER — Ambulatory Visit (INDEPENDENT_AMBULATORY_CARE_PROVIDER_SITE_OTHER): Payer: BLUE CROSS/BLUE SHIELD | Admitting: Family

## 2023-10-26 ENCOUNTER — Encounter: Payer: Self-pay | Admitting: Family

## 2023-10-26 VITALS — BP 110/70 | HR 70 | Temp 97.5°F | Resp 18

## 2023-10-26 DIAGNOSIS — J4540 Moderate persistent asthma, uncomplicated: Secondary | ICD-10-CM | POA: Diagnosis not present

## 2023-10-26 DIAGNOSIS — K219 Gastro-esophageal reflux disease without esophagitis: Secondary | ICD-10-CM

## 2023-10-26 DIAGNOSIS — J3089 Other allergic rhinitis: Secondary | ICD-10-CM

## 2023-10-26 NOTE — Progress Notes (Signed)
[address] [place] [address] Dept: [phone]  FOLLOW UP NOTE  Patient ID: Paige Anthony, female    DOB: [birth year]  Age: 47 y.o. MRN: [id number] Date of Office Visit: 10/26/2023  Assessment  Chief Complaint: No chief complaint on file.  HPI Paige Anthony is a 47 year old female who presents today for an acute visit of cough, shortness of breath, and wheezing.  She was last seen on July 24, 2023 by Dr. Lucie Leather for well-controlled moderate persistent asthma, perennial allergic rhinitis, gastroesophageal reflux disease, and vertigo.  She denies any new diagnosis or surgery since her last office visit.  She reports since this past Monday she has had a dry cough from her chest.  The cough is worse when lying down.  Wednesday she also had wheezing and used her albuterol 2 times and it helped.  Last night she also had shortness of breath.  The cough is waking her up at night and she is not able to sleep.  She denies tightness in her chest, fever, chills, and body aches.  She does report that her little one has a cough and used the nebulizer and was better.  She has used her albuterol 4 times this week and it has helped.  She reports that she has not been sick for 2 years, but reports that she is feeling more tired today.  She reports a long time ago she was on a daily inhaler.  Perennial allergic rhinitis: She denies sore throat, rhinorrhea, nasal congestion, and postnasal drip.  She reports that her throat is dry.  She is currently taking cetirizine 10 mg daily and using over-the-counter Nasacort/Nasonex 2-3 times a week.  She has not been treated for any sinus infections since we last saw her.  Gastroesophageal reflux disease: She reports that she is currently taking two 20 mg omeprazole tablets once a day.  She reports that she will have reflux symptoms sometimes.  Vertigo is reported as resolved.  She reports that this week her blood sugar has dropped 2  times.  On Wednesday her blood sugar was 45.  She feels tired.  She woke up her husband who gave her an apple to eat.  Recommended that she call her primary care physician and schedule an appointment to discuss.  She reports that she has an appointment next week with her primary care physician.   Drug Allergies:  Allergies  Allergen Reactions   Latex Rash    Review of Systems: Negative except as per HPI   Physical Exam: BP 110/70   Pulse 70   Temp (!) 97.5 F (36.4 C) (Temporal)   Resp 18   SpO2 98%    Physical Exam Constitutional:      Appearance: Normal appearance.  HENT:     Head: Normocephalic and atraumatic.     Comments: Pharynx normal, eyes normal, ears normal, nose normal    Right Ear: Tympanic membrane, ear canal and external ear normal.     Left Ear: Tympanic membrane, ear canal and external ear normal.     Nose: Nose normal.     Mouth/Throat:     Mouth: Mucous membranes are moist.     Pharynx: Oropharynx is clear.  Eyes:     Conjunctiva/sclera: Conjunctivae normal.  Cardiovascular:     Rate and Rhythm: Regular rhythm.     Heart sounds: Normal heart sounds.  Pulmonary:     Effort: Pulmonary effort is normal.     Breath  sounds: Normal breath sounds.     Comments: Lungs clear to auscultation Musculoskeletal:     Cervical back: Neck supple.  Skin:    General: Skin is warm.  Neurological:     Mental Status: She is alert and oriented to person, place, and time.  Psychiatric:        Mood and Affect: Mood normal.        Behavior: Behavior normal.        Thought Content: Thought content normal.        Judgment: Judgment normal.     Diagnostics: FVC 2.24 L (67%), FEV1 2.02 L (74%), FEV1/FVC 0.90 L.  Spirometry indicates possible mild restriction.  Spirometry is consistent with previous spirometry.  Assessment and Plan: 1. Perennial allergic rhinitis   2. Not well controlled moderate persistent asthma   3. Gastroesophageal reflux disease, unspecified  whether esophagitis present     No orders of the defined types were placed in this encounter.   Patient Instructions   1.  Allergen avoidance measures - dust mite  2.  Treat and prevent reflux:   A.  Omeprazole 40 mg - 1 tablet 3-7 times per week  3.  If needed:   A.  Albuterol HFA- 2 inhalations every 4-6 hours  B.  OTC Zyrtec/cetirizine 10 mg - 1 tablet 1 time per day  C.  OTC nasal saline  D.  OTC Nasacort / Nasonex -1 spray each nostril 3-7 times a week  4.  Return to clinic in 12 months or earlier if problem  5.For symptoms start Airsupra using 2 puffs every 4-6 hours as needed for cough, wheeze, tightness in chest, or shortness of breath.  Do not exceed 12 puffs in 24 hours.  Discussed how this inhaler has a steroid in it along with albuterol.  So do not use your albuterol inhaler while using Airsupra.  Sample of Airsupra given.  Discussed if symptoms worsen she could call the on-call physician and we would send in prednisone.  Call me Monday with an update Make sure to keep your appointment next week with your primary care physician to discuss your low blood sugars.   6.Plan for fall flu vaccine  Follow-up in 4 to 6 weeks or sooner if needed        Return in about 4 weeks (around 11/23/2023).    Thank you for the opportunity to care for this patient.  Please do not hesitate to contact me with questions.  Nehemiah Settle, FNP [facility]

## 2023-10-26 NOTE — Patient Instructions (Addendum)
  1.  Allergen avoidance measures - dust mite  2.  Treat and prevent reflux:   A.  Omeprazole 40 mg - 1 tablet 3-7 times per week  3.  If needed:   A.  Albuterol HFA- 2 inhalations every 4-6 hours  B.  OTC Zyrtec/cetirizine 10 mg - 1 tablet 1 time per day  C.  OTC nasal saline  D.  OTC Nasacort / Nasonex -1 spray each nostril 3-7 times a week  4.  Return to clinic in 12 months or earlier if problem  5.For symptoms start Airsupra using 2 puffs every 4-6 hours as needed for cough, wheeze, tightness in chest, or shortness of breath.  Do not exceed 12 puffs in 24 hours.  Discussed how this inhaler has a steroid in it along with albuterol.  So do not use your albuterol inhaler while using Airsupra.  Sample of Airsupra given.  Discussed if symptoms worsen she could call the on-call physician and we would send in prednisone.  Call me Monday with an update Make sure to keep your appointment next week with your primary care physician to discuss your low blood sugars.   6.Plan for fall flu vaccine  Follow-up in 4 to 6 weeks or sooner if needed

## 2023-11-13 ENCOUNTER — Telehealth: Payer: Self-pay | Admitting: *Deleted

## 2023-11-13 NOTE — Telephone Encounter (Signed)
Pt informed that she will need to go to urgent care or PCP to be evaluated.

## 2023-11-13 NOTE — Telephone Encounter (Signed)
Spoke to patient advised below, she states she saw pcp yesterday they focused on her diabetes and high blood pressure,  they ignored her cough since lungs sounded clear was told to follow up with asthma specialist. She is aware out of network and needs office visit to evaluate and treat correctly. She also declined flu shot since she has been ill most of of fall. She will follow up with pcp or Urgent care.

## 2023-11-13 NOTE — Telephone Encounter (Signed)
Coughing, chest tightness for 3 weeks- productive green mucus. Using albuterol every 4 hours for the last week. Simply talking she gets tired. Having sore throat. Last week she felt feverish. Taking cetirizine daily, Nasacort daily, saline in the nose. She came in with her sons for their appointments (sons were previously treated for pneumonia) and her insurance is out of network so per [place] [facility] routing to [facility] because [facility] thinks she might need an antibiotic.

## 2023-11-13 NOTE — Telephone Encounter (Signed)
If her insurance is out of network as it appears she needs to schedule an appointment at a facility where she is in network. Please have her schedule an appointment with her primary care physician or go to urgent care.

## 2024-08-14 ENCOUNTER — Other Ambulatory Visit: Payer: Self-pay

## 2024-08-14 ENCOUNTER — Emergency Department (HOSPITAL_BASED_OUTPATIENT_CLINIC_OR_DEPARTMENT_OTHER)

## 2024-08-14 ENCOUNTER — Other Ambulatory Visit (HOSPITAL_BASED_OUTPATIENT_CLINIC_OR_DEPARTMENT_OTHER): Payer: Self-pay

## 2024-08-14 ENCOUNTER — Emergency Department (HOSPITAL_BASED_OUTPATIENT_CLINIC_OR_DEPARTMENT_OTHER)
Admission: EM | Admit: 2024-08-14 | Discharge: 2024-08-14 | Disposition: A | Discharge status: Home / Self Care | Attending: Emergency Medicine | Admitting: Emergency Medicine

## 2024-08-14 DIAGNOSIS — J45909 Unspecified asthma, uncomplicated: Secondary | ICD-10-CM | POA: Diagnosis not present

## 2024-08-14 DIAGNOSIS — Z91040 Latex allergy status: Secondary | ICD-10-CM | POA: Insufficient documentation

## 2024-08-14 DIAGNOSIS — E119 Type 2 diabetes mellitus without complications: Secondary | ICD-10-CM | POA: Diagnosis not present

## 2024-08-14 DIAGNOSIS — I10 Essential (primary) hypertension: Secondary | ICD-10-CM | POA: Diagnosis not present

## 2024-08-14 DIAGNOSIS — K8050 Calculus of bile duct without cholangitis or cholecystitis without obstruction: Secondary | ICD-10-CM | POA: Diagnosis not present

## 2024-08-14 DIAGNOSIS — Z7951 Long term (current) use of inhaled steroids: Secondary | ICD-10-CM | POA: Diagnosis not present

## 2024-08-14 DIAGNOSIS — Z7984 Long term (current) use of oral hypoglycemic drugs: Secondary | ICD-10-CM | POA: Diagnosis not present

## 2024-08-14 DIAGNOSIS — R1011 Right upper quadrant pain: Secondary | ICD-10-CM | POA: Diagnosis present

## 2024-08-14 DIAGNOSIS — K8020 Calculus of gallbladder without cholecystitis without obstruction: Secondary | ICD-10-CM

## 2024-08-14 LAB — URINALYSIS, ROUTINE W REFLEX MICROSCOPIC
Bilirubin Urine: NEGATIVE
Glucose, UA: 500 mg/dL — AB
Hgb urine dipstick: NEGATIVE
Ketones, ur: NEGATIVE mg/dL
Nitrite: POSITIVE — AB
Protein, ur: NEGATIVE mg/dL
Specific Gravity, Urine: 1.015 (ref 1.005–1.030)
pH: 6.5 (ref 5.0–8.0)

## 2024-08-14 LAB — COMPREHENSIVE METABOLIC PANEL WITH GFR
ALT: 21 U/L (ref 0–44)
AST: 16 U/L (ref 15–41)
Albumin: 4.1 g/dL (ref 3.5–5.0)
Alkaline Phosphatase: 164 U/L — ABNORMAL HIGH (ref 38–126)
Anion gap: 12 (ref 5–15)
BUN: 10 mg/dL (ref 6–20)
CO2: 27 mmol/L (ref 22–32)
Calcium: 10.2 mg/dL (ref 8.9–10.3)
Chloride: 97 mmol/L — ABNORMAL LOW (ref 98–111)
Creatinine, Ser: 0.56 mg/dL (ref 0.44–1.00)
GFR, Estimated: >60 mL/min (ref >60)
Glucose, Bld: 189 mg/dL — ABNORMAL HIGH (ref 70–99)
Potassium: 4.4 mmol/L (ref 3.5–5.1)
Sodium: 135 mmol/L (ref 135–145)
Total Bilirubin: 0.5 mg/dL (ref 0.0–1.2)
Total Protein: 7.1 g/dL (ref 6.5–8.1)

## 2024-08-14 LAB — CBC
HCT: 42 % (ref 36.0–46.0)
Hemoglobin: 14.1 g/dL (ref 12.0–15.0)
MCH: 29.2 pg (ref 26.0–34.0)
MCHC: 33.6 g/dL (ref 30.0–36.0)
MCV: 87 fL (ref 80.0–100.0)
Platelets: 303 K/uL (ref 150–400)
RBC: 4.83 MIL/uL (ref 3.87–5.11)
RDW: 12.1 % (ref 11.5–15.5)
WBC: 11.7 K/uL — ABNORMAL HIGH (ref 4.0–10.5)
nRBC: 0 % (ref 0.0–0.2)

## 2024-08-14 LAB — LIPASE, BLOOD: Lipase: 41 U/L (ref 11–51)

## 2024-08-14 LAB — HCG, SERUM, QUALITATIVE: Preg, Serum: NEGATIVE

## 2024-08-14 MED ORDER — SODIUM CHLORIDE 0.9 % IV BOLUS
1000.0000 mL | Freq: Once | INTRAVENOUS | Status: AC
  Administered 2024-08-14: 1000 mL via INTRAVENOUS

## 2024-08-14 MED ORDER — ONDANSETRON HCL 4 MG/2ML IJ SOLN
4.0000 mg | Freq: Once | INTRAMUSCULAR | Status: AC
  Administered 2024-08-14: 4 mg via INTRAVENOUS
  Filled 2024-08-14: qty 2

## 2024-08-14 MED ORDER — FOSFOMYCIN TROMETHAMINE 3 G PO PACK: 3.0000 g | PACK | Freq: Once | ORAL | Status: DC

## 2024-08-14 MED ORDER — ONDANSETRON 4 MG PO TBDP
4.0000 mg | ORAL_TABLET | Freq: Three times a day (TID) | ORAL | 0 refills | Status: AC | PRN
  Filled 2024-08-14: qty 20, 7d supply, fill #0

## 2024-08-14 NOTE — ED Notes (Signed)
Lab called for culture add on

## 2024-08-14 NOTE — ED Notes (Signed)
Pt aware urine specimen ordered. Pt reports inability to provide specimen at this time. Specimen collection device provided to patient. 

## 2024-08-14 NOTE — ED Triage Notes (Signed)
 Pt via pov from home with epigastric pain since Monday. She reports similar pain a few months ago. She states she awakened at 0300 with nausea and pain on Monday. She states she feels bloated. Pt a&o x 4; nad noted.

## 2024-08-14 NOTE — ED Provider Notes (Signed)
 [place] EMERGENCY DEPARTMENT AT [facility] Provider Note   CSN: [id number] Arrival date & time: 08/14/24  1223     Patient presents with: Abdominal Pain   Paige Anthony is a 48 y.o. female.   48 year old female with history of T2DM, HTN, asthma presenting due to 4-day history of nonradiating RUQ pain.  Pain woke her up from sleep. Also has 1 day history of dysuria. No hematuria or vaginal bleeding/discharge.  Does endorse left flank pain.  Denies history of UTI or kidney stone. Reports similar RUQ pain happened a few months ago and went away. Does notice that pain worsens after eating big meals. She had a larger meal yesterday and pain is bad today. Denies fevers, active nausea or vomiting, chest pain or shortness of breath. She takes omeprazole  at home but this has not helped much.  Denies taking NSAIDs like ibuprofen  or Aleve   Abdominal Pain Associated symptoms: dysuria   Associated symptoms: no chest pain, no chills, no diarrhea, no fever, no hematuria, no nausea, no shortness of breath, no vaginal bleeding and no vaginal discharge         Prior to Admission medications   Medication Sig Start Date End Date Taking? Authorizing Provider  albuterol  (PROVENTIL ,VENTOLIN ) 90 MCG/ACT inhaler Inhale 2 puffs into the lungs every 6 (six) hours as needed. Shortness of breath     [provider]  Blood Glucose Monitoring Suppl (BLOOD GLUCOSE SYSTEM PAK) KIT Use as directed to monitor FSBS 1x daily. Dx: E11.9. 09/17/19   Bari Theodoro FALCON, MD  cetirizine  (ZYRTEC ) 10 MG tablet Take 1 tablet (10 mg total) by mouth 2 (two) times daily as needed for allergies (Can take an extra dose during flare ups.). 04/25/22   Kozlow, Eric J, MD  fluticasone furoate-vilanterol (BREO ELLIPTA ) 200-25 MCG/INH AEPB 1 puff daily for asthma 09/28/20   Wilcox, Kawanta F, MD  glipiZIDE  (GLUCOTROL ) 5 MG tablet TAKE 1 TABLET BY MOUTH DAILY BEFORE BREAKFAST. 01/27/21   Shipman, Theodoro FALCON,  MD  ibuprofen  (ADVIL ,MOTRIN ) 600 MG tablet Take 1 tablet (600 mg total) by mouth every 6 (six) hours as needed. 05/11/18   Garnell Pieter DASEN, MD  meloxicam  (MOBIC ) 15 MG tablet Take 1 tablet (15 mg total) by mouth daily. 09/15/19   Bari Theodoro FALCON, MD  metFORMIN  (GLUCOPHAGE ) 1000 MG tablet THEN 1 TABLET TWICE A DAY WITH MEALS 10/25/20   Marie, Kawanta F, MD  Multiple Vitamin (MULTIVITAMIN) tablet Take 1 tablet by mouth daily.    [provider]  omeprazole  (PRILOSEC ) 40 MG capsule Take 1 capsule (40 mg total) by mouth daily. 04/25/22   Kozlow, Eric J, MD  OneTouch Delica Lancets 33G MISC USE TO CHECK FASTING BLOOD SUGAR EVERY DAY 12/29/20   Bari Theodoro FALCON, MD  [facility] VERIO test strip USE TO CHECK FASTING BLOOD SUGAR ONCE DAILY 12/29/20   Bari Theodoro FALCON, MD  pioglitazone (ACTOS) 30 MG tablet Take 15 mg by mouth daily. 03/09/22   [provider]  triamcinolone  (NASACORT ) 55 MCG/ACT AERO nasal inhaler Place 1 spray into the nose daily. 04/25/22   Kozlow, Camellia PARAS, MD    Allergies: Latex    Review of Systems  Constitutional:  Negative for chills and fever.  Respiratory:  Negative for shortness of breath.   Cardiovascular:  Negative for chest pain.  Gastrointestinal:  Positive for abdominal pain. Negative for diarrhea and nausea.  Genitourinary:  Positive for dysuria and flank pain. Negative for hematuria, vaginal bleeding and vaginal  discharge.  Neurological:  Negative for weakness, light-headedness and numbness.    Updated Vital Signs BP (!) 142/80 (BP Location: Right Arm)   Pulse 69   Temp 97.6 F (36.4 C) (Oral)   Resp 18   Ht 5' 5 (1.651 m)   Wt 91.8 kg   LMP 07/13/2024 (Exact Date)   SpO2 99%   BMI 33.68 kg/m   Physical Exam Vitals and nursing note reviewed.  Constitutional:      General: She is not in acute distress.    Appearance: She is well-developed.  HENT:     Head: Normocephalic and atraumatic.  Eyes:     Conjunctiva/sclera: Conjunctivae normal.   Cardiovascular:     Rate and Rhythm: Normal rate and regular rhythm.     Heart sounds: No murmur heard. Pulmonary:     Effort: Pulmonary effort is normal. No respiratory distress.     Breath sounds: Normal breath sounds.  Abdominal:     General: Bowel sounds are normal. There is no distension.     Palpations: Abdomen is soft.     Tenderness: There is abdominal tenderness in the right upper quadrant. There is left CVA tenderness. There is no right CVA tenderness, guarding or rebound. Positive signs include Murphy's sign.  Musculoskeletal:        General: No swelling.     Cervical back: Neck supple.  Skin:    General: Skin is warm and dry.     Capillary Refill: Capillary refill takes less than 2 seconds.  Neurological:     Mental Status: She is alert.  Psychiatric:        Mood and Affect: Mood normal.     (all labs ordered are listed, but only abnormal results are displayed) Labs Reviewed  CBC - Abnormal; Notable for the following components:      Result Value   WBC 11.7 (*)    All other components within normal limits  LIPASE, BLOOD  COMPREHENSIVE METABOLIC PANEL WITH GFR  URINALYSIS, ROUTINE W REFLEX MICROSCOPIC  HCG, SERUM, QUALITATIVE    EKG: None  Radiology: No results found.   Procedures   Medications Ordered in the ED  ondansetron  (ZOFRAN ) injection 4 mg (has no administration in time range)  sodium chloride  0.9 % bolus 1,000 mL (has no administration in time range)                                    Medical Decision Making 48 year old female with history of T2DM, HTN, asthma presenting due to 4-day history of nonradiating RUQ pain as well as dysuria and left flank pain. Afebrile, hemodynamically stable RUQ tenderness and positive Murphy sign with left CVA tenderness on exam.  Otherwise clinically well-appearing Differential diagnosis of her RUQ pain includes hepatobiliary etiology such as cholecystitis or cholelithiasis, pancreatitis, less likely  reflux/gastritis/PUD.  Obtain RUQ ultrasound and check CMP, CBC, lipase Dysuria and left flank pain concerning for cystitis versus pyelonephritis versus nephrolithiasis.  Check UA and obtain CT renal stone study  1:26 PM Awaiting workup, Dr. Ruthe taking over care  Amount and/or Complexity of Data Reviewed Labs: ordered. Radiology: ordered.  Risk Prescription drug management.       Final diagnoses:  None    ED Discharge Orders     None          Romelle Booty, MD 08/14/24 1328    [place], DO 08/14/24 1330

## 2024-08-14 NOTE — ED Provider Notes (Addendum)
 Patient here with abdominal pain nausea.  Normal-appearing last few days.  Similar episode a few months ago.  Pain somewhat right upper quadrant but also left flank.  No history of kidney stone.  No history of abdominal surgery.  She has had some UTIs in the past.  Denies any vaginal bleeding or discharge.  Differential diagnosis is gastritis versus pancreatitis versus cholecystitis versus biliary colic versus less likely kidney stone.  Could be pyelonephritis UTI.  Will check CBC CMP lipase urinalysis CT renal stone study right upper quadrant ultrasound will give IV fluids IV Zofran .  Is not having any major discomfort at this time and declined pain medicine.  Overall lab work shows no significant leukocytosis anemia or electrolyte abnormality.  Gallbladder and liver enzymes within normal limits.  Lipase normal.  Pregnancy test negative.  Patient has gallstones but no evidence of acute cholecystitis.  CT scan was overall unremarkable as well.  No kidney stone.  She is feeling pretty well.  Recommended that she try to avoid fatty foods and foods high in protein.  Take Zofran  as needed.  Please return if she develops worsening pain fever or uncontrollable nausea or vomiting.  Otherwise we will have her follow-up with general surgery to talk about elective removal of her gallbladder.  At this time she stable for discharge.  Understands turn precautions.  Urinalysis showed some nitrites but no leukocytes.  She had a lot of epithelial cells.  Rare bacteria.  She not having any urinary symptoms.  I will send off a urine culture and will hold on any treatment for this at this time.  I think her pain is most likely from symptomatic gallstones.  Discharge in good condition.  This chart was dictated using voice recognition software.  Despite best efforts to proofread,  errors can occur which can change the documentation meaning.    Ruthe Cornet, DO 08/14/24 1406    Ruthe Cornet, DO 08/14/24 1414

## 2024-08-14 NOTE — Discharge Instructions (Signed)
 Follow-up with Dr. [facility] for your gallstones.  They will discuss with you about possibly removing your gallbladder electively to help with your pain long-term.  Please return if you develop fever/constant right upper quadrant abdominal pain, uncontrollable nausea vomiting and pain as you may need reevaluation prior to then.  Try to avoid fatty foods and foods high in protein which can sometimes cause your gallbladder to have worsening pain.  Take Zofran  as needed for nausea vomiting.  Continue Tylenol  for pain.

## 2024-08-16 LAB — URINE CULTURE: Culture: >=100000 — AB

## 2024-08-17 ENCOUNTER — Telehealth (HOSPITAL_BASED_OUTPATIENT_CLINIC_OR_DEPARTMENT_OTHER): Payer: Self-pay | Admitting: *Deleted

## 2024-08-17 NOTE — Progress Notes (Signed)
 ED Antimicrobial Stewardship Positive Culture Follow Up   Paige Anthony is an 48 y.o. female who presented to [facility] on @ADMITDT @ with a chief complaint of  Chief Complaint  Patient presents with   Abdominal Pain    Recent Results (from the past 720 hours)  Urine Culture     Status: Abnormal   Collection Time: 08/14/24  2:00 PM   Specimen: Urine, Clean Catch  Result Value Ref Range Status   Specimen Description   Final    URINE, CLEAN CATCH Performed at [facility], [address], [place], [place] [address]    Special Requests   Final    NONE Performed at [facility], [address], [place], [place] [address]    Culture >=100,000 COLONIES/mL ESCHERICHIA COLI (A)  Final   Report Status 08/16/2024 FINAL  Final   Organism ID, Bacteria ESCHERICHIA COLI (A)  Final      Susceptibility   Escherichia coli - MIC*    AMPICILLIN  <=2 SENSITIVE Sensitive     CEFAZOLIN (URINE) Value in next row Sensitive      <=1 SENSITIVEThis is a modified FDA-approved test that has been validated and its performance characteristics determined by the reporting laboratory.  This laboratory is certified under the Clinical Laboratory Improvement Amendments CLIA as qualified to perform high complexity clinical laboratory testing.    CEFEPIME Value in next row Sensitive      <=1 SENSITIVEThis is a modified FDA-approved test that has been validated and its performance characteristics determined by the reporting laboratory.  This laboratory is certified under the Clinical Laboratory Improvement Amendments CLIA as qualified to perform high complexity clinical laboratory testing.    ERTAPENEM Value in next row Sensitive      <=1 SENSITIVEThis is a modified FDA-approved test that has been validated and its performance characteristics determined by the reporting laboratory.  This laboratory is certified under the Clinical Laboratory Improvement Amendments CLIA as  qualified to perform high complexity clinical laboratory testing.    CEFTRIAXONE Value in next row Sensitive      <=1 SENSITIVEThis is a modified FDA-approved test that has been validated and its performance characteristics determined by the reporting laboratory.  This laboratory is certified under the Clinical Laboratory Improvement Amendments CLIA as qualified to perform high complexity clinical laboratory testing.    CIPROFLOXACIN Value in next row Sensitive      <=1 SENSITIVEThis is a modified FDA-approved test that has been validated and its performance characteristics determined by the reporting laboratory.  This laboratory is certified under the Clinical Laboratory Improvement Amendments CLIA as qualified to perform high complexity clinical laboratory testing.    GENTAMICIN Value in next row Sensitive      <=1 SENSITIVEThis is a modified FDA-approved test that has been validated and its performance characteristics determined by the reporting laboratory.  This laboratory is certified under the Clinical Laboratory Improvement Amendments CLIA as qualified to perform high complexity clinical laboratory testing.    NITROFURANTOIN  Value in next row Sensitive      <=1 SENSITIVEThis is a modified FDA-approved test that has been validated and its performance characteristics determined by the reporting laboratory.  This laboratory is certified under the Clinical Laboratory Improvement Amendments CLIA as qualified to perform high complexity clinical laboratory testing.    TRIMETH/SULFA Value in next row Sensitive      <=1 SENSITIVEThis is a modified FDA-approved test that has been validated and its performance characteristics determined by the reporting laboratory.  This laboratory is certified under the Clinical Laboratory Improvement Amendments CLIA as qualified to perform high complexity clinical laboratory testing.    AMPICILLIN /SULBACTAM Value in next row Sensitive      <=1 SENSITIVEThis is a modified  FDA-approved test that has been validated and its performance characteristics determined by the reporting laboratory.  This laboratory is certified under the Clinical Laboratory Improvement Amendments CLIA as qualified to perform high complexity clinical laboratory testing.    PIP/TAZO Value in next row Sensitive ug/mL     <=4 SENSITIVEThis is a modified FDA-approved test that has been validated and its performance characteristics determined by the reporting laboratory.  This laboratory is certified under the Clinical Laboratory Improvement Amendments CLIA as qualified to perform high complexity clinical laboratory testing.    MEROPENEM Value in next row Sensitive      <=4 SENSITIVEThis is a modified FDA-approved test that has been validated and its performance characteristics determined by the reporting laboratory.  This laboratory is certified under the Clinical Laboratory Improvement Amendments CLIA as qualified to perform high complexity clinical laboratory testing.    * >=100,000 COLONIES/mL ESCHERICHIA COLI    [x]  Patient discharged originally without antimicrobial agent and treatment is now indicated  New antibiotic prescription: Macrobid  100 mg BID x 5 days (Qty 10; Refills 0)  ED Provider: Elnor Jayson Dorn Job, PharmD, BCPS 08/17/2024 10:23 AM ED Clinical Pharmacist -  [phone]

## 2024-08-17 NOTE — Telephone Encounter (Signed)
 Post ED Visit - Positive Culture Follow-up: Unsuccessful Patient Follow-up  Culture assessed and recommendations reviewed by:  [x]  Dorn Buttner, Pharm.D. []  Venetia Gully, Pharm.D., BCPS AQ-ID []  Garrel Crews, Pharm.D., BCPS []  Almarie Lunger, Pharm.D., BCPS []  [place], [address].D., BCPS, AAHIVP []  Rosaline Bihari, Pharm.D., BCPS, AAHIVP []  Massie Rigg, PharmD []  Jodie Rower, PharmD, BCPS  Positive urine culture  [x]  Patient discharged without antimicrobial prescription and treatment is now indicated []  Organism is resistant to prescribed ED discharge antimicrobial []  Patient with positive blood cultures  Plan: Start Macrobid  100mg  BID x 5 days (qty 10, 0 refills) per Jayson Pereyra, DO  Unable to contact patient via interpreter after 3 attempts, letter will be sent to address on file  Paige Anthony 08/17/2024, 1:30 PM

## 2024-08-22 ENCOUNTER — Other Ambulatory Visit (HOSPITAL_BASED_OUTPATIENT_CLINIC_OR_DEPARTMENT_OTHER): Payer: Self-pay

## 2024-08-22 ENCOUNTER — Telehealth (HOSPITAL_BASED_OUTPATIENT_CLINIC_OR_DEPARTMENT_OTHER): Payer: Self-pay | Admitting: Emergency Medicine

## 2024-08-22 MED ORDER — NITROFURANTOIN MONOHYD MACRO 100 MG PO CAPS
100.0000 mg | ORAL_CAPSULE | Freq: Two times a day (BID) | ORAL | 0 refills | Status: AC
  Filled 2024-08-22: qty 10, 5d supply, fill #0

## 2024-08-22 NOTE — Telephone Encounter (Signed)
 Post ED Visit - Positive Culture Follow-up: Successful Patient Follow-Up  Culture assessed and recommendations reviewed by:  []  [place], Pharm.D. []  Venetia Gully, Pharm.D., BCPS AQ-ID []  Garrel Crews, Pharm.D., BCPS []  Almarie Lunger, Pharm.D., BCPS []  [place], [place].D., BCPS, AAHIVP []  Rosaline Bihari, Pharm.D., BCPS, AAHIVP []  Vernell Meier, PharmD, BCPS []  Latanya Hint, PharmD, BCPS []  Donald Medley, PharmD, BCPS []  Rocky Bold, PharmD  Positive urine culture  [x]  Patient discharged without antimicrobial prescription and treatment is now indicated []  Organism is resistant to prescribed ED discharge antimicrobial []  Patient with positive blood cultures  Changes discussed with ED provider: Bernarda Pereyra MD New antibiotic prescription Macrobid  100 mg BID x five days Called to [facility] [phone]  Contacted patient, date 08/22/24, time 1530   Paige Anthony 08/22/2024, 3:33 PM

## 2024-10-03 ENCOUNTER — Ambulatory Visit: Admitting: Family Medicine

## 2024-12-02 SURGERY — Surgical Case
Anesthesia: *Unknown
